# Patient Record
Sex: Female | Born: 1955 | Race: White | Hispanic: No | Marital: Married | Smoking: Current every day smoker
Health system: Southern US, Community
[De-identification: ages and names within clinical notes are randomized; demographics above are authoritative.]

## PROBLEM LIST (undated history)

## (undated) DIAGNOSIS — K8050 Calculus of bile duct without cholangitis or cholecystitis without obstruction: Secondary | ICD-10-CM

## (undated) DIAGNOSIS — I10 Essential (primary) hypertension: Secondary | ICD-10-CM

## (undated) DIAGNOSIS — K219 Gastro-esophageal reflux disease without esophagitis: Secondary | ICD-10-CM

## (undated) DIAGNOSIS — Z8719 Personal history of other diseases of the digestive system: Secondary | ICD-10-CM

## (undated) DIAGNOSIS — F32A Depression, unspecified: Secondary | ICD-10-CM

## (undated) DIAGNOSIS — M1990 Unspecified osteoarthritis, unspecified site: Secondary | ICD-10-CM

## (undated) DIAGNOSIS — Z8639 Personal history of other endocrine, nutritional and metabolic disease: Secondary | ICD-10-CM

## (undated) DIAGNOSIS — K2980 Duodenitis without bleeding: Secondary | ICD-10-CM

## (undated) DIAGNOSIS — F329 Major depressive disorder, single episode, unspecified: Secondary | ICD-10-CM

## (undated) DIAGNOSIS — J449 Chronic obstructive pulmonary disease, unspecified: Secondary | ICD-10-CM

## (undated) DIAGNOSIS — R51 Headache: Secondary | ICD-10-CM

## (undated) DIAGNOSIS — K2970 Gastritis, unspecified, without bleeding: Secondary | ICD-10-CM

## (undated) DIAGNOSIS — R7303 Prediabetes: Secondary | ICD-10-CM

## (undated) DIAGNOSIS — M797 Fibromyalgia: Secondary | ICD-10-CM

## (undated) DIAGNOSIS — R519 Headache, unspecified: Secondary | ICD-10-CM

## (undated) DIAGNOSIS — J45909 Unspecified asthma, uncomplicated: Secondary | ICD-10-CM

## (undated) HISTORY — PX: ERCP: SHX5425

## (undated) HISTORY — PX: CHOLECYSTECTOMY: SHX55

---

## 2006-03-28 ENCOUNTER — Ambulatory Visit: Payer: Self-pay | Admitting: Nurse Practitioner

## 2008-05-09 ENCOUNTER — Inpatient Hospital Stay: Payer: Self-pay | Admitting: Internal Medicine

## 2008-09-11 ENCOUNTER — Ambulatory Visit: Payer: Self-pay

## 2009-11-17 ENCOUNTER — Emergency Department: Payer: Self-pay | Admitting: Emergency Medicine

## 2010-05-09 ENCOUNTER — Emergency Department: Payer: Self-pay | Admitting: Unknown Physician Specialty

## 2012-03-21 ENCOUNTER — Emergency Department: Payer: Self-pay | Admitting: Emergency Medicine

## 2012-08-02 ENCOUNTER — Ambulatory Visit: Payer: Self-pay | Admitting: Internal Medicine

## 2012-12-29 LAB — URINALYSIS, COMPLETE
Bilirubin,UR: NEGATIVE
Ketone: NEGATIVE
Ph: 5 (ref 4.5–8.0)
Protein: 30
RBC,UR: 5 /HPF (ref 0–5)
WBC UR: 70 /HPF (ref 0–5)

## 2012-12-29 LAB — COMPREHENSIVE METABOLIC PANEL
Anion Gap: 6 — ABNORMAL LOW (ref 7–16)
BUN: 10 mg/dL (ref 7–18)
Calcium, Total: 9.4 mg/dL (ref 8.5–10.1)
Chloride: 99 mmol/L (ref 98–107)
Creatinine: 0.99 mg/dL (ref 0.60–1.30)
EGFR (African American): >60
EGFR (Non-African Amer.): >60
Glucose: 126 mg/dL — ABNORMAL HIGH (ref 65–99)
Osmolality: 269 (ref 275–301)
SGOT(AST): 71 U/L — ABNORMAL HIGH (ref 15–37)
SGPT (ALT): 73 U/L (ref 12–78)
Sodium: 134 mmol/L — ABNORMAL LOW (ref 136–145)
Total Protein: 7.6 g/dL (ref 6.4–8.2)

## 2012-12-29 LAB — LIPASE, BLOOD: Lipase: 377 U/L (ref 73–393)

## 2012-12-29 LAB — CBC
MCV: 83 fL (ref 80–100)
Platelet: 435 10*3/uL (ref 150–440)
RBC: 4.06 10*6/uL (ref 3.80–5.20)
RDW: 14.1 % (ref 11.5–14.5)
WBC: 13 10*3/uL — ABNORMAL HIGH (ref 3.6–11.0)

## 2012-12-30 ENCOUNTER — Observation Stay: Payer: Self-pay | Admitting: Surgery

## 2013-01-02 LAB — PATHOLOGY REPORT

## 2014-01-07 ENCOUNTER — Inpatient Hospital Stay: Payer: Self-pay | Admitting: Internal Medicine

## 2014-01-07 LAB — CBC
HCT: 36.8 % (ref 35.0–47.0)
HGB: 11.9 g/dL — ABNORMAL LOW (ref 12.0–16.0)
MCH: 28 pg (ref 26.0–34.0)
MCHC: 32.4 g/dL (ref 32.0–36.0)
MCV: 86 fL (ref 80–100)
PLATELETS: 256 10*3/uL (ref 150–440)
RBC: 4.26 10*6/uL (ref 3.80–5.20)
RDW: 14.2 % (ref 11.5–14.5)
WBC: 10.4 10*3/uL (ref 3.6–11.0)

## 2014-01-07 LAB — LIPASE, BLOOD: Lipase: 369 U/L (ref 73–393)

## 2014-01-07 LAB — URINALYSIS, COMPLETE
BACTERIA: NONE SEEN
Bilirubin,UR: NEGATIVE
Blood: NEGATIVE
Glucose,UR: NEGATIVE mg/dL (ref 0–75)
Ketone: NEGATIVE
NITRITE: NEGATIVE
Ph: 6 (ref 4.5–8.0)
Protein: NEGATIVE
SPECIFIC GRAVITY: 1.01 (ref 1.003–1.030)
Squamous Epithelial: 1

## 2014-01-07 LAB — COMPREHENSIVE METABOLIC PANEL
ALK PHOS: 245 U/L — AB
ALT: 205 U/L — AB
Albumin: 3.8 g/dL (ref 3.4–5.0)
Anion Gap: 8 (ref 7–16)
BUN: 12 mg/dL (ref 7–18)
Bilirubin,Total: 0.8 mg/dL (ref 0.2–1.0)
CALCIUM: 9 mg/dL (ref 8.5–10.1)
CREATININE: 0.88 mg/dL (ref 0.60–1.30)
Chloride: 101 mmol/L (ref 98–107)
Co2: 27 mmol/L (ref 21–32)
EGFR (African American): >60
GLUCOSE: 93 mg/dL (ref 65–99)
Osmolality: 271 (ref 275–301)
Potassium: 4.5 mmol/L (ref 3.5–5.1)
SGOT(AST): 542 U/L — ABNORMAL HIGH (ref 15–37)
Sodium: 136 mmol/L (ref 136–145)
TOTAL PROTEIN: 7.2 g/dL (ref 6.4–8.2)

## 2014-01-07 LAB — TROPONIN I

## 2014-01-08 LAB — BASIC METABOLIC PANEL
ANION GAP: 8 (ref 7–16)
BUN: 8 mg/dL (ref 7–18)
CO2: 29 mmol/L (ref 21–32)
CREATININE: 1.05 mg/dL (ref 0.60–1.30)
Calcium, Total: 8.4 mg/dL — ABNORMAL LOW (ref 8.5–10.1)
Chloride: 99 mmol/L (ref 98–107)
EGFR (African American): >60
EGFR (Non-African Amer.): 57 — ABNORMAL LOW
Glucose: 73 mg/dL (ref 65–99)
Osmolality: 269 (ref 275–301)
Potassium: 3.4 mmol/L — ABNORMAL LOW (ref 3.5–5.1)
SODIUM: 136 mmol/L (ref 136–145)

## 2014-01-08 LAB — CBC WITH DIFFERENTIAL/PLATELET
Basophil #: 0 10*3/uL (ref 0.0–0.1)
Basophil #: 0 10*3/uL (ref 0.0–0.1)
Basophil %: 0.6 %
Basophil %: 0.7 %
EOS ABS: 0.1 10*3/uL (ref 0.0–0.7)
EOS PCT: 3.4 %
Eosinophil #: 0.1 10*3/uL (ref 0.0–0.7)
Eosinophil %: 1.7 %
HCT: 34.8 % — ABNORMAL LOW (ref 35.0–47.0)
HCT: 32.4 % — ABNORMAL LOW (ref 35.0–47.0)
HGB: 11.3 g/dL — ABNORMAL LOW (ref 12.0–16.0)
HGB: 10.4 g/dL — ABNORMAL LOW (ref 12.0–16.0)
LYMPHS ABS: 1.8 10*3/uL (ref 1.0–3.6)
LYMPHS ABS: 1.4 10*3/uL (ref 1.0–3.6)
LYMPHS PCT: 37 %
Lymphocyte %: 36.2 %
MCH: 28.1 pg (ref 26.0–34.0)
MCH: 28.3 pg (ref 26.0–34.0)
MCHC: 32.5 g/dL (ref 32.0–36.0)
MCHC: 32.2 g/dL (ref 32.0–36.0)
MCV: 86 fL (ref 80–100)
MCV: 88 fL (ref 80–100)
MONOS PCT: 10.1 %
Monocyte #: 0.5 x10 3/mm (ref 0.2–0.9)
Monocyte #: 0.4 x10 3/mm (ref 0.2–0.9)
Monocyte %: 9.7 %
NEUTROS ABS: 2.5 10*3/uL (ref 1.4–6.5)
Neutrophil #: 1.9 10*3/uL (ref 1.4–6.5)
Neutrophil %: 51.8 %
Neutrophil %: 48.8 %
PLATELETS: 253 10*3/uL (ref 150–440)
Platelet: 244 10*3/uL (ref 150–440)
RBC: 4.03 10*6/uL (ref 3.80–5.20)
RBC: 3.69 10*6/uL — ABNORMAL LOW (ref 3.80–5.20)
RDW: 14.1 % (ref 11.5–14.5)
RDW: 13.9 % (ref 11.5–14.5)
WBC: 4.9 10*3/uL (ref 3.6–11.0)
WBC: 3.9 10*3/uL (ref 3.6–11.0)

## 2014-01-08 LAB — HEPATIC FUNCTION PANEL A (ARMC)
ALBUMIN: 3.5 g/dL (ref 3.4–5.0)
ALT: 1158 U/L — AB
Alkaline Phosphatase: 319 U/L — ABNORMAL HIGH
Bilirubin, Direct: 0.6 mg/dL — ABNORMAL HIGH (ref 0.0–0.2)
Bilirubin,Total: 1.2 mg/dL — ABNORMAL HIGH (ref 0.2–1.0)
SGOT(AST): >2000 U/L — ABNORMAL HIGH (ref 15–37)
TOTAL PROTEIN: 6.9 g/dL (ref 6.4–8.2)

## 2014-01-08 LAB — COMPREHENSIVE METABOLIC PANEL
ALT: 920 U/L — AB
Albumin: 3.1 g/dL — ABNORMAL LOW (ref 3.4–5.0)
Alkaline Phosphatase: 359 U/L — ABNORMAL HIGH
Anion Gap: 7 (ref 7–16)
BILIRUBIN TOTAL: 1.2 mg/dL — AB (ref 0.2–1.0)
BUN: 6 mg/dL — ABNORMAL LOW (ref 7–18)
CHLORIDE: 105 mmol/L (ref 98–107)
CO2: 27 mmol/L (ref 21–32)
Calcium, Total: 8 mg/dL — ABNORMAL LOW (ref 8.5–10.1)
Creatinine: 1.05 mg/dL (ref 0.60–1.30)
EGFR (Non-African Amer.): 57 — ABNORMAL LOW
Glucose: 74 mg/dL (ref 65–99)
Osmolality: 274 (ref 275–301)
POTASSIUM: 3.8 mmol/L (ref 3.5–5.1)
SGOT(AST): 1365 U/L — ABNORMAL HIGH (ref 15–37)
Sodium: 139 mmol/L (ref 136–145)
Total Protein: 6.5 g/dL (ref 6.4–8.2)

## 2014-01-08 LAB — PROTIME-INR
INR: 0.9
INR: 0.9
PROTHROMBIN TIME: 12 s (ref 11.5–14.7)
PROTHROMBIN TIME: 12.4 s (ref 11.5–14.7)

## 2014-01-08 LAB — MAGNESIUM: Magnesium: 2.4 mg/dL

## 2014-01-09 LAB — COMPREHENSIVE METABOLIC PANEL
ALK PHOS: 366 U/L — AB
ALT: 655 U/L — AB
Albumin: 3 g/dL — ABNORMAL LOW (ref 3.4–5.0)
Anion Gap: 7 (ref 7–16)
BUN: 5 mg/dL — ABNORMAL LOW (ref 7–18)
Bilirubin,Total: 0.7 mg/dL (ref 0.2–1.0)
CALCIUM: 8.2 mg/dL — AB (ref 8.5–10.1)
CHLORIDE: 108 mmol/L — AB (ref 98–107)
CO2: 26 mmol/L (ref 21–32)
CREATININE: 0.99 mg/dL (ref 0.60–1.30)
EGFR (African American): >60
Glucose: 74 mg/dL (ref 65–99)
OSMOLALITY: 277 (ref 275–301)
POTASSIUM: 3.9 mmol/L (ref 3.5–5.1)
SGOT(AST): 829 U/L — ABNORMAL HIGH (ref 15–37)
Sodium: 141 mmol/L (ref 136–145)
Total Protein: 6.4 g/dL (ref 6.4–8.2)

## 2014-01-09 LAB — CBC WITH DIFFERENTIAL/PLATELET
BASOS ABS: 0 10*3/uL (ref 0.0–0.1)
Basophil %: 0.7 %
Eosinophil #: 0.3 10*3/uL (ref 0.0–0.7)
Eosinophil %: 5.1 %
HCT: 32.8 % — ABNORMAL LOW (ref 35.0–47.0)
HGB: 10.7 g/dL — AB (ref 12.0–16.0)
Lymphocyte #: 1.5 10*3/uL (ref 1.0–3.6)
Lymphocyte %: 30.8 %
MCH: 28.2 pg (ref 26.0–34.0)
MCHC: 32.6 g/dL (ref 32.0–36.0)
MCV: 87 fL (ref 80–100)
Monocyte #: 0.5 x10 3/mm (ref 0.2–0.9)
Monocyte %: 9.4 %
NEUTROS PCT: 54 %
Neutrophil #: 2.7 10*3/uL (ref 1.4–6.5)
PLATELETS: 234 10*3/uL (ref 150–440)
RBC: 3.79 10*6/uL — ABNORMAL LOW (ref 3.80–5.20)
RDW: 14.3 % (ref 11.5–14.5)
WBC: 4.9 10*3/uL (ref 3.6–11.0)

## 2014-01-10 LAB — CBC WITH DIFFERENTIAL/PLATELET
BASOS ABS: 0 10*3/uL (ref 0.0–0.1)
Basophil %: 0.4 %
EOS ABS: 0.1 10*3/uL (ref 0.0–0.7)
Eosinophil %: 1.4 %
HCT: 29.2 % — ABNORMAL LOW (ref 35.0–47.0)
HGB: 9.5 g/dL — ABNORMAL LOW (ref 12.0–16.0)
LYMPHS ABS: 1.2 10*3/uL (ref 1.0–3.6)
LYMPHS PCT: 15.5 %
MCH: 28.3 pg (ref 26.0–34.0)
MCHC: 32.5 g/dL (ref 32.0–36.0)
MCV: 87 fL (ref 80–100)
MONOS PCT: 10.9 %
Monocyte #: 0.8 x10 3/mm (ref 0.2–0.9)
Neutrophil #: 5.5 10*3/uL (ref 1.4–6.5)
Neutrophil %: 71.8 %
PLATELETS: 215 10*3/uL (ref 150–440)
RBC: 3.36 10*6/uL — AB (ref 3.80–5.20)
RDW: 14.4 % (ref 11.5–14.5)
WBC: 7.6 10*3/uL (ref 3.6–11.0)

## 2014-01-10 LAB — COMPREHENSIVE METABOLIC PANEL
ALK PHOS: 455 U/L — AB
ALT: 393 U/L — AB
ANION GAP: 8 (ref 7–16)
Albumin: 2.6 g/dL — ABNORMAL LOW (ref 3.4–5.0)
BUN: 4 mg/dL — AB (ref 7–18)
Bilirubin,Total: 2.5 mg/dL — ABNORMAL HIGH (ref 0.2–1.0)
Calcium, Total: 7.7 mg/dL — ABNORMAL LOW (ref 8.5–10.1)
Chloride: 105 mmol/L (ref 98–107)
Co2: 23 mmol/L (ref 21–32)
Creatinine: 0.88 mg/dL (ref 0.60–1.30)
EGFR (Non-African Amer.): >60
Glucose: 95 mg/dL (ref 65–99)
OSMOLALITY: 269 (ref 275–301)
Potassium: 3.7 mmol/L (ref 3.5–5.1)
SGOT(AST): 481 U/L — ABNORMAL HIGH (ref 15–37)
Sodium: 136 mmol/L (ref 136–145)
Total Protein: 5.7 g/dL — ABNORMAL LOW (ref 6.4–8.2)

## 2014-01-10 LAB — LIPASE, BLOOD: Lipase: 244 U/L (ref 73–393)

## 2014-01-11 LAB — CBC WITH DIFFERENTIAL/PLATELET
Basophil #: 0 10*3/uL (ref 0.0–0.1)
Basophil %: 0.4 %
EOS ABS: 0.3 10*3/uL (ref 0.0–0.7)
EOS PCT: 5 %
HCT: 28.2 % — ABNORMAL LOW (ref 35.0–47.0)
HGB: 9.4 g/dL — ABNORMAL LOW (ref 12.0–16.0)
Lymphocyte #: 1.6 10*3/uL (ref 1.0–3.6)
Lymphocyte %: 28.4 %
MCH: 28.5 pg (ref 26.0–34.0)
MCHC: 33.2 g/dL (ref 32.0–36.0)
MCV: 86 fL (ref 80–100)
MONOS PCT: 11.2 %
Monocyte #: 0.6 x10 3/mm (ref 0.2–0.9)
Neutrophil #: 3 10*3/uL (ref 1.4–6.5)
Neutrophil %: 55 %
Platelet: 208 10*3/uL (ref 150–440)
RBC: 3.28 10*6/uL — ABNORMAL LOW (ref 3.80–5.20)
RDW: 14.5 % (ref 11.5–14.5)
WBC: 5.5 10*3/uL (ref 3.6–11.0)

## 2014-01-11 LAB — HEPATIC FUNCTION PANEL A (ARMC)
Albumin: 2.5 g/dL — ABNORMAL LOW (ref 3.4–5.0)
Alkaline Phosphatase: 409 U/L — ABNORMAL HIGH
BILIRUBIN DIRECT: 0.5 mg/dL — AB (ref 0.0–0.2)
Bilirubin,Total: 0.8 mg/dL (ref 0.2–1.0)
SGOT(AST): 283 U/L — ABNORMAL HIGH (ref 15–37)
SGPT (ALT): 281 U/L — ABNORMAL HIGH
TOTAL PROTEIN: 5.5 g/dL — AB (ref 6.4–8.2)

## 2014-01-18 LAB — COMPREHENSIVE METABOLIC PANEL
ALT: 149 U/L — AB
Albumin: 3.9 g/dL (ref 3.4–5.0)
Alkaline Phosphatase: 330 U/L — ABNORMAL HIGH
Anion Gap: 10 (ref 7–16)
BUN: 14 mg/dL (ref 7–18)
Bilirubin,Total: 0.7 mg/dL (ref 0.2–1.0)
CREATININE: 1.15 mg/dL (ref 0.60–1.30)
Calcium, Total: 9.5 mg/dL (ref 8.5–10.1)
Chloride: 96 mmol/L — ABNORMAL LOW (ref 98–107)
Co2: 27 mmol/L (ref 21–32)
GFR CALC NON AF AMER: 51 — AB
Glucose: 142 mg/dL — ABNORMAL HIGH (ref 65–99)
Osmolality: 269 (ref 275–301)
Potassium: 3.5 mmol/L (ref 3.5–5.1)
SGOT(AST): 331 U/L — ABNORMAL HIGH (ref 15–37)
Sodium: 133 mmol/L — ABNORMAL LOW (ref 136–145)
Total Protein: 7.9 g/dL (ref 6.4–8.2)

## 2014-01-18 LAB — URINALYSIS, COMPLETE
Bacteria: NONE SEEN
Bilirubin,UR: NEGATIVE
Blood: NEGATIVE
Glucose,UR: NEGATIVE mg/dL (ref 0–75)
Ketone: NEGATIVE
Nitrite: NEGATIVE
Ph: 6 (ref 4.5–8.0)
Protein: NEGATIVE
RBC,UR: 3 /HPF (ref 0–5)
Specific Gravity: 1.016 (ref 1.003–1.030)
Squamous Epithelial: 2
WBC UR: 5 /HPF (ref 0–5)

## 2014-01-18 LAB — CBC WITH DIFFERENTIAL/PLATELET
BASOS PCT: 0.2 %
Basophil #: 0 10*3/uL (ref 0.0–0.1)
Eosinophil #: 0.1 10*3/uL (ref 0.0–0.7)
Eosinophil %: 0.8 %
HCT: 36.6 % (ref 35.0–47.0)
HGB: 11.7 g/dL — AB (ref 12.0–16.0)
LYMPHS PCT: 12.2 %
Lymphocyte #: 1.5 10*3/uL (ref 1.0–3.6)
MCH: 27.8 pg (ref 26.0–34.0)
MCHC: 32 g/dL (ref 32.0–36.0)
MCV: 87 fL (ref 80–100)
MONOS PCT: 7.9 %
Monocyte #: 0.9 x10 3/mm (ref 0.2–0.9)
Neutrophil #: 9.4 10*3/uL — ABNORMAL HIGH (ref 1.4–6.5)
Neutrophil %: 78.9 %
Platelet: 455 10*3/uL — ABNORMAL HIGH (ref 150–440)
RBC: 4.22 10*6/uL (ref 3.80–5.20)
RDW: 14.8 % — ABNORMAL HIGH (ref 11.5–14.5)
WBC: 11.9 10*3/uL — ABNORMAL HIGH (ref 3.6–11.0)

## 2014-01-18 LAB — LIPASE, BLOOD: Lipase: 789 U/L — ABNORMAL HIGH (ref 73–393)

## 2014-01-18 LAB — TROPONIN I: Troponin-I: <0.02 ng/mL

## 2014-01-19 ENCOUNTER — Inpatient Hospital Stay: Payer: Self-pay | Admitting: Internal Medicine

## 2014-01-19 LAB — COMPREHENSIVE METABOLIC PANEL
ALBUMIN: 3.1 g/dL — AB (ref 3.4–5.0)
AST: 1850 U/L — AB (ref 15–37)
Alkaline Phosphatase: 391 U/L — ABNORMAL HIGH
Anion Gap: 8 (ref 7–16)
BUN: 9 mg/dL (ref 7–18)
Bilirubin,Total: 1.2 mg/dL — ABNORMAL HIGH (ref 0.2–1.0)
CO2: 27 mmol/L (ref 21–32)
CREATININE: 0.95 mg/dL (ref 0.60–1.30)
Calcium, Total: 8.2 mg/dL — ABNORMAL LOW (ref 8.5–10.1)
Chloride: 105 mmol/L (ref 98–107)
Glucose: 100 mg/dL — ABNORMAL HIGH (ref 65–99)
OSMOLALITY: 278 (ref 275–301)
Potassium: 3.6 mmol/L (ref 3.5–5.1)
SGPT (ALT): 853 U/L — ABNORMAL HIGH
SODIUM: 140 mmol/L (ref 136–145)
Total Protein: 6.6 g/dL (ref 6.4–8.2)

## 2014-01-19 LAB — LIPASE, BLOOD: Lipase: 430 U/L — ABNORMAL HIGH (ref 73–393)

## 2014-01-20 LAB — COMPREHENSIVE METABOLIC PANEL
ANION GAP: 9 (ref 7–16)
Albumin: 2.7 g/dL — ABNORMAL LOW (ref 3.4–5.0)
Alkaline Phosphatase: 483 U/L — ABNORMAL HIGH
BILIRUBIN TOTAL: 1.2 mg/dL — AB (ref 0.2–1.0)
BUN: 5 mg/dL — ABNORMAL LOW (ref 7–18)
CALCIUM: 7.9 mg/dL — AB (ref 8.5–10.1)
CO2: 24 mmol/L (ref 21–32)
Chloride: 107 mmol/L (ref 98–107)
Creatinine: 0.68 mg/dL (ref 0.60–1.30)
EGFR (African American): >60
EGFR (Non-African Amer.): >60
Glucose: 88 mg/dL (ref 65–99)
Osmolality: 276 (ref 275–301)
POTASSIUM: 3.7 mmol/L (ref 3.5–5.1)
SGOT(AST): 825 U/L — ABNORMAL HIGH (ref 15–37)
SGPT (ALT): 545 U/L — ABNORMAL HIGH
Sodium: 140 mmol/L (ref 136–145)
TOTAL PROTEIN: 5.9 g/dL — AB (ref 6.4–8.2)

## 2014-01-20 LAB — CBC WITH DIFFERENTIAL/PLATELET
BASOS PCT: 0.4 %
Basophil #: 0 10*3/uL (ref 0.0–0.1)
EOS ABS: 0.2 10*3/uL (ref 0.0–0.7)
EOS PCT: 3.8 %
HCT: 31.1 % — AB (ref 35.0–47.0)
HGB: 10 g/dL — AB (ref 12.0–16.0)
LYMPHS ABS: 1.4 10*3/uL (ref 1.0–3.6)
Lymphocyte %: 24.5 %
MCH: 28.1 pg (ref 26.0–34.0)
MCHC: 32 g/dL (ref 32.0–36.0)
MCV: 88 fL (ref 80–100)
Monocyte #: 0.6 x10 3/mm (ref 0.2–0.9)
Monocyte %: 10 %
Neutrophil #: 3.5 10*3/uL (ref 1.4–6.5)
Neutrophil %: 61.3 %
PLATELETS: 320 10*3/uL (ref 150–440)
RBC: 3.54 10*6/uL — ABNORMAL LOW (ref 3.80–5.20)
RDW: 15 % — AB (ref 11.5–14.5)
WBC: 5.7 10*3/uL (ref 3.6–11.0)

## 2014-01-20 LAB — LIPASE, BLOOD: Lipase: 371 U/L (ref 73–393)

## 2014-01-21 LAB — COMPREHENSIVE METABOLIC PANEL
ALT: 351 U/L — AB
ANION GAP: 5 — AB (ref 7–16)
AST: 447 U/L — AB (ref 15–37)
Albumin: 2.9 g/dL — ABNORMAL LOW (ref 3.4–5.0)
Alkaline Phosphatase: 456 U/L — ABNORMAL HIGH
BUN: 3 mg/dL — AB (ref 7–18)
Bilirubin,Total: 0.7 mg/dL (ref 0.2–1.0)
Calcium, Total: 8.4 mg/dL — ABNORMAL LOW (ref 8.5–10.1)
Chloride: 108 mmol/L — ABNORMAL HIGH (ref 98–107)
Co2: 27 mmol/L (ref 21–32)
Creatinine: 0.69 mg/dL (ref 0.60–1.30)
EGFR (African American): >60
EGFR (Non-African Amer.): >60
Glucose: 90 mg/dL (ref 65–99)
Osmolality: 275 (ref 275–301)
Potassium: 3.3 mmol/L — ABNORMAL LOW (ref 3.5–5.1)
Sodium: 140 mmol/L (ref 136–145)
Total Protein: 6 g/dL — ABNORMAL LOW (ref 6.4–8.2)

## 2014-01-21 LAB — LIPASE, BLOOD: Lipase: 337 U/L (ref 73–393)

## 2014-01-22 LAB — COMPREHENSIVE METABOLIC PANEL
ALBUMIN: 3.1 g/dL — AB (ref 3.4–5.0)
Alkaline Phosphatase: 427 U/L — ABNORMAL HIGH
Anion Gap: 6 — ABNORMAL LOW (ref 7–16)
BUN: 2 mg/dL — AB (ref 7–18)
Bilirubin,Total: 0.6 mg/dL (ref 0.2–1.0)
CALCIUM: 8.5 mg/dL (ref 8.5–10.1)
CO2: 29 mmol/L (ref 21–32)
Chloride: 104 mmol/L (ref 98–107)
Creatinine: 0.81 mg/dL (ref 0.60–1.30)
EGFR (Non-African Amer.): >60
Glucose: 88 mg/dL (ref 65–99)
OSMOLALITY: 273 (ref 275–301)
Potassium: 2.9 mmol/L — ABNORMAL LOW (ref 3.5–5.1)
SGOT(AST): 315 U/L — ABNORMAL HIGH (ref 15–37)
SGPT (ALT): 291 U/L — ABNORMAL HIGH
Sodium: 139 mmol/L (ref 136–145)
Total Protein: 6.8 g/dL (ref 6.4–8.2)

## 2014-01-22 LAB — MAGNESIUM: Magnesium: 1.8 mg/dL

## 2014-01-22 LAB — LIPASE, BLOOD: Lipase: 294 U/L (ref 73–393)

## 2014-01-23 LAB — URINALYSIS, COMPLETE
Bacteria: NONE SEEN
Bilirubin,UR: NEGATIVE
Blood: NEGATIVE
Glucose,UR: NEGATIVE mg/dL (ref 0–75)
KETONE: NEGATIVE
LEUKOCYTE ESTERASE: NEGATIVE
NITRITE: NEGATIVE
PROTEIN: NEGATIVE
Ph: 5 (ref 4.5–8.0)
SPECIFIC GRAVITY: 1.004 (ref 1.003–1.030)
Squamous Epithelial: <1
WBC UR: 2 /HPF (ref 0–5)

## 2014-01-23 LAB — COMPREHENSIVE METABOLIC PANEL
Albumin: 2.9 g/dL — ABNORMAL LOW (ref 3.4–5.0)
Alkaline Phosphatase: 332 U/L — ABNORMAL HIGH
Anion Gap: 6 — ABNORMAL LOW (ref 7–16)
BUN: 3 mg/dL — AB (ref 7–18)
Bilirubin,Total: 0.4 mg/dL (ref 0.2–1.0)
CHLORIDE: 109 mmol/L — AB (ref 98–107)
CREATININE: 0.87 mg/dL (ref 0.60–1.30)
Calcium, Total: 8.4 mg/dL — ABNORMAL LOW (ref 8.5–10.1)
Co2: 27 mmol/L (ref 21–32)
EGFR (African American): >60
EGFR (Non-African Amer.): >60
Glucose: 89 mg/dL (ref 65–99)
Osmolality: 279 (ref 275–301)
Potassium: 4.1 mmol/L (ref 3.5–5.1)
SGOT(AST): 180 U/L — ABNORMAL HIGH (ref 15–37)
SGPT (ALT): 190 U/L — ABNORMAL HIGH
SODIUM: 142 mmol/L (ref 136–145)
Total Protein: 5.9 g/dL — ABNORMAL LOW (ref 6.4–8.2)

## 2014-01-23 LAB — MAGNESIUM: MAGNESIUM: 1.8 mg/dL

## 2014-01-23 LAB — LIPASE, BLOOD: Lipase: 206 U/L (ref 73–393)

## 2014-01-31 ENCOUNTER — Emergency Department: Payer: Self-pay | Admitting: Emergency Medicine

## 2014-01-31 LAB — CBC WITH DIFFERENTIAL/PLATELET
Basophil #: 0.1 10*3/uL (ref 0.0–0.1)
Basophil %: 0.9 %
Eosinophil #: 0.3 10*3/uL (ref 0.0–0.7)
Eosinophil %: 4.2 %
HCT: 39.1 % (ref 35.0–47.0)
HGB: 12.8 g/dL (ref 12.0–16.0)
LYMPHS ABS: 2.2 10*3/uL (ref 1.0–3.6)
LYMPHS PCT: 30.3 %
MCH: 28 pg (ref 26.0–34.0)
MCHC: 32.8 g/dL (ref 32.0–36.0)
MCV: 85 fL (ref 80–100)
Monocyte #: 0.6 x10 3/mm (ref 0.2–0.9)
Monocyte %: 8.6 %
NEUTROS PCT: 56 %
Neutrophil #: 4 10*3/uL (ref 1.4–6.5)
PLATELETS: 435 10*3/uL (ref 150–440)
RBC: 4.59 10*6/uL (ref 3.80–5.20)
RDW: 14.5 % (ref 11.5–14.5)
WBC: 7.1 10*3/uL (ref 3.6–11.0)

## 2014-01-31 LAB — COMPREHENSIVE METABOLIC PANEL
ALBUMIN: 3.8 g/dL (ref 3.4–5.0)
Alkaline Phosphatase: 221 U/L — ABNORMAL HIGH
Anion Gap: 6 — ABNORMAL LOW (ref 7–16)
BILIRUBIN TOTAL: 0.4 mg/dL (ref 0.2–1.0)
BUN: 8 mg/dL (ref 7–18)
CHLORIDE: 100 mmol/L (ref 98–107)
CO2: 31 mmol/L (ref 21–32)
Calcium, Total: 9.9 mg/dL (ref 8.5–10.1)
Creatinine: 1.07 mg/dL (ref 0.60–1.30)
EGFR (African American): >60
EGFR (Non-African Amer.): 56 — ABNORMAL LOW
GLUCOSE: 101 mg/dL — AB (ref 65–99)
OSMOLALITY: 272 (ref 275–301)
POTASSIUM: 3.7 mmol/L (ref 3.5–5.1)
SGOT(AST): 61 U/L — ABNORMAL HIGH (ref 15–37)
SGPT (ALT): 51 U/L
Sodium: 137 mmol/L (ref 136–145)
TOTAL PROTEIN: 7.9 g/dL (ref 6.4–8.2)

## 2014-01-31 LAB — LIPASE, BLOOD: LIPASE: 296 U/L (ref 73–393)

## 2014-01-31 LAB — TROPONIN I: Troponin-I: <0.02 ng/mL

## 2014-03-21 ENCOUNTER — Ambulatory Visit: Payer: Self-pay | Admitting: Gastroenterology

## 2014-06-07 NOTE — H&P (Signed)
History of Present Illness 45 yowf with a few weeks of epigastric pain, which got much worse last evening. Associated with nausea and chills, but no vomiting or fever. Last meal was lunch; anorexic since. Denies changes in urine, stool, skin, or eye color. Denies all GU Sx, including hesitancy, frequency, dysuria, hematuria.   Past History GERD Panic attacks Fibromyalgia HTN Dysipidemia   Past Med/Surgical Hx:  High Cholesterol:   HTN:   GERD - Esophageal Reflux:   ALLERGIES:  Codeine: GI Distress  Tetracycline: Itching  Sulfa drugs: Itching  HOME MEDICATIONS: Medication Instructions Status  naproxen 500 mg oral tablet 1 tab(s) orally 2 times a day Active  predniSONE 20 mg oral tablet 3 tab(s) orally once a day x 5 days Active  ProAir HFA CFC free 90 mcg/inh inhalation aerosol 2 puff(s) inhaled every 4- 6 hours, As Needed- for Shortness of Breath , for Wheezing  Active  omeprazole 20 mg oral enteric coated tablet 1 tab(s) orally once a day  Active  pravastatin 10 mg oral tablet 1 tab(s) orally once a day  Active  Lopressor 50 mg oral tablet 1 tab(s) orally 2 times a day  Active  amitriptyline 50 mg oral tablet 1 tab(s) orally once a day (at bedtime)  Active  Ascriptin Enteric 81 mg oral enteric coated tablet 1  orally once a day  Active  lisinopril 40 mg oral tablet 1 tab(s) orally once a day Active  gabapentin 300 mg oral capsule 1 cap(s) orally 3 times a day Active   Family and Social History:  Family History Non-Contributory   Social History positive  tobacco (Current within 1 year), negative ETOH, negative Illicit drugs, married, 2 kids, doesn't work, smokes 1 - 1.5 ppd   Place of Living Home   Review of Systems:  Fever/Chills No   Cough No   Sputum No   Abdominal Pain Yes   Diarrhea No   Constipation No   Nausea/Vomiting Yes   SOB/DOE No   Chest Pain No   Dysuria No   Tolerating PT Yes   Tolerating Diet Yes  Nauseated   Medications/Allergies  Reviewed Medications/Allergies reviewed   Physical Exam:  GEN well developed, well nourished, no acute distress   HEENT pink conjunctivae, PERRL, hearing intact to voice, moist oral mucosa, Oropharynx clear   NECK supple  No masses   RESP normal resp effort  clear BS  no use of accessory muscles   CARD regular rate  no murmur  no thrills  no JVD  no Rub   ABD denies tenderness  soft   EXTR negative cyanosis/clubbing, negative edema   SKIN normal to palpation, skin turgor good   NEURO cranial nerves intact, follows commands, motor/sensory function intact   PSYCH alert, A+O to time, place, person, poor insight   Lab Results: Hepatic:  14-Nov-14 19:29   Bilirubin, Total 0.4  Alkaline Phosphatase  288  SGPT (ALT) 73  SGOT (AST)  71  Total Protein, Serum 7.6  Albumin, Serum  3.3  Routine Chem:  14-Nov-14 19:29   Glucose, Serum  126  BUN 10  Creatinine (comp) 0.99  Sodium, Serum  134  Potassium, Serum 3.5  Chloride, Serum 99  CO2, Serum 29  Calcium (Total), Serum 9.4  Osmolality (calc) 269  eGFR (African American) >60  eGFR (Non-African American) >60 (eGFR values <4m/min/1.73 m2 may be an indication of chronic kidney disease (CKD). Calculated eGFR is useful in patients with stable renal function. The eGFR calculation  will not be reliable in acutely ill patients when serum creatinine is changing rapidly. It is not useful in  patients on dialysis. The eGFR calculation may not be applicable to patients at the low and high extremes of body sizes, pregnant women, and vegetarians.)  Anion Gap  6  Lipase 377 (Result(s) reported on 29 Dec 2012 at 08:11PM.)  Cardiac:  14-Nov-14 19:29   Troponin I < 0.02 (0.00-0.05 0.05 ng/mL or less: NEGATIVE  Repeat testing in 3-6 hrs  if clinically indicated. >0.05 ng/mL: POTENTIAL  MYOCARDIAL INJURY. Repeat  testing in 3-6 hrs if  clinically indicated. NOTE: An increase or decrease  of 30% or more on serial  testing suggests  a  clinically important change)  Routine UA:  14-Nov-14 19:29   Color (UA) Yellow  Clarity (UA) Hazy  Glucose (UA) Negative  Bilirubin (UA) Negative  Ketones (UA) Negative  Specific Gravity (UA) 1.024  Blood (UA) Negative  pH (UA) 5.0  Protein (UA) 30 mg/dL  Nitrite (UA) Negative  Leukocyte Esterase (UA) 3+ (Result(s) reported on 29 Dec 2012 at 08:04PM.)  RBC (UA) 5 /HPF  WBC (UA) 70 /HPF  Bacteria (UA) TRACE  Epithelial Cells (UA) 9 /HPF  Transitional Epithelial (UA) 1 /HPF  Mucous (UA) PRESENT (Result(s) reported on 29 Dec 2012 at 08:04PM.)  Routine Hem:  14-Nov-14 19:29   WBC (CBC)  13.0  RBC (CBC) 4.06  Hemoglobin (CBC)  11.2  Hematocrit (CBC)  33.6  Platelet Count (CBC) 435 (Result(s) reported on 29 Dec 2012 at 08:00PM.)  MCV 83  MCH 27.6  MCHC 33.3  RDW 14.1   Radiology Results: [place]:    14-Nov-14 23:15, US Abdomen General Survey  US Abdomen General Survey  REASON FOR EXAM:    epigastric pain, worse after eating; UTI; evaluate   for cholecystitis and hydrone  COMMENTS:       PROCEDURE: [place]  - US ABDOMEN GENERAL SURVEY  - Dec 29 2012 11:15PM     CLINICAL DATA:  Epigastric abdominal pain, worse after eating.    EXAM:  ULTRASOUND ABDOMEN COMPLETE    COMPARISON:  None.    FINDINGS:  Gallbladder  A 0.7 cm stone is noted lodged at the gallbladder neck. The  gallbladder appears distended, though the gallbladder wall remains  borderline normal in thickness. A small amount of echogenic sludge  is noted layering dependently within the gallbladder. No definite  pericholecystic fluid is seen. A positive ultrasonographic Murphy's  sign is elicited, raising concern for obstruction.    Common bile duct    Diameter: 0.8 cm; dilated, possibly reflecting distal obstruction.    Liver    No focal lesion identified. Diffusely increased parenchymal  echogenicity and coarsened echotexture, compatible with fatty  infiltration. The previously noted hepatic cyst is not  visualized on  this study.    IVC    No abnormality visualized.    Pancreas    Visualized portion unremarkable.    Spleen    Size and appearance within normal limits.  Right Kidney    Length: 9.4 cm. Echogenicity within normal limits. No mass or  hydronephrosis visualized.    Left Kidney    Length: 9.4 cm. Echogenicity within normal limits. No mass or  hydronephrosis visualized.    Abdominal aorta    No aneurysm visualized.     IMPRESSION:  1. Mild dilatation of the common hepatic duct, with dilatation of  the gallbladder and a positive ultrasonographic Murphy's sign. This  raises concern for distal  obstruction.  2. 0.7 cm stone also noted lodged at the neck of the gallbladder,  with sludge seen layering dependently in the gallbladder.  3. Diffuse fatty infiltration within the liver.      Electronically Signed    By: Garald Balding M.D.    On: 12/29/2012 23:30         Verified By: JEFFREY . CHANG, M.D.,  LabUnknown:  PACS Image    Assessment/Admission Diagnosis Acute cholecystitis UTI   Plan Admit, IV ABx Lap ccy in AM. Pt and husband unstand 1/200 risk of CBD injury and agree.   Electronic Signatures: Consuela Mimes (MD)  (Signed [phone] 00:27)  Authored: CHIEF COMPLAINT and HISTORY, PAST MEDICAL/SURGIAL HISTORY, ALLERGIES, HOME MEDICATIONS, FAMILY AND SOCIAL HISTORY, REVIEW OF SYSTEMS, PHYSICAL EXAM, LABS, Radiology, ASSESSMENT AND PLAN   Last Updated: 15-Nov-14 00:27 by Consuela Mimes (MD)

## 2014-06-07 NOTE — Op Note (Signed)
PATIENT NAME:  Jaime Kidd, Jaime Kidd MR#:  [id number] DATE OF BIRTH:  11/25/1955  DATE OF PROCEDURE:  12/30/2012  OPERATION PERFORMED: Laparoscopic cholecystectomy  PREOPERATIVE DIAGNOSIS:  Acute cholecystitis.  POSTOPERATIVE DIAGNOSIS:  Acute cholecystitis, suppurative.  SURGEON: Consuela Mimes, MD  ANESTHESIA: General.   PROCEDURE IN DETAIL: The patient was placed supine on the Operating Room table, and prepped and draped in the usual sterile fashion. A 15 mmHg CO2 pneumoperitoneum was created with a Veress needle in the infraumbilical position, and this was exchanged for a 5 mm trocar and a 30-degree angled laparoscope. Remaining trocars were placed under direct visualization. The gallbladder was decompressed of clear bile, and the fundus was grasped and retracted superiorly and ventrally. Filmy adhesions which were somewhat fibrinous were taken down from the gallbladder neck, and this was retracted laterally to open up the triangle of Calot. There was a significant amount of edema here. Dissection revealed a clear anterior cystic artery going directly into the gallbladder. This was doubly clipped and divided. The cystic duct was then dissected out, and in doing so, a hole was made in the gallbladder cystic duct junction, and 10 or 20 mL of pus drained through this hole. The cystic duct was adequately controlled with 2 hemoclips and divided, and the gallbladder was removed from the liver bed with the electrocautery, placed in an Endo Catch bag, and extracted from the abdomen via the epigastric port. This port site fascia was closed with a laparoscopic puncture closure device and 0 Vicryl suture, and then the right upper quadrant was suctioned clear and irrigated serially with warm normal saline, being suctioned clear between each successive irrigation. At the conclusion, the right upper quadrant was completely clean and dry, and there was no evidence of pus or bile staining. Hemostasis was excellent.  I had to place 2 or 3 clips on a large posterior cystic artery at the liver bed during the gallbladder removal, in addition to the 4 previously-placed clips. The peritoneum was desufflated and decannulated, and all 4 skin sites were closed with subcuticular 5-0 Monocryl and suture strips. The patient tolerated the procedure well. There were no complications.      ____________________________ Consuela Mimes, MD wfm:mr D: 12/30/2012 22:15:14 ET T: 12/30/2012 22:36:19 ET JOB#: [id number]  cc: Consuela Mimes, MD, <Dictator> Consuela Mimes MD ELECTRONICALLY SIGNED 12/31/2012 21:06

## 2014-06-08 NOTE — Consult Note (Signed)
Details:   - GI Note:  Abd pain decreased. Toelrating clear liquids.  No f/c, no n/v.  Exam: vss nad, a and o abd mild epi tenderness, nabs, soft  Labs: liver enzymes improved, but AST,ALT still quite elevated.   MRCP showed CBD dil and filing defect in CBD, likely stones.   a/p - ERCP Mon or Tues depending on advanced endoscopis t availability - npo mn - no am anti-coag - watch for s/s cholangitis   Electronic Signatures: Arther Dames (MD)  (Signed 06-Dec-15 17:24)  Authored: Details   Last Updated: 06-Dec-15 17:24 by Arther Dames (MD)

## 2014-06-08 NOTE — Consult Note (Signed)
ERCP done. Few stones/sludge present. These were removed. However, bile still not draining possibly due to diverticulum pushing on CBD. Therefore, 10 Fr x 5 cm biliary stent placed. Bile draining now. Clear liquids rest of today. Moniter LFT. Hopefully, discharge soon. Will need to repeat ERCP in 2 months or so as outpt to remove stent and to make sure bile duct is cleared.  Electronic Signatures: Verdie Shire (MD)  (Signed on 07-Dec-15 13:41)  Authored  Last Updated: 07-Dec-15 13:41 by Verdie Shire (MD)

## 2014-06-08 NOTE — Consult Note (Signed)
PATIENT NAME:  Jaime Kidd, Jaime Kidd MR#:  [id number] DATE OF BIRTH:  08-08-1955  DATE OF CONSULTATION:  01/08/2014  REFERRING PHYSICIAN:   CONSULTING PHYSICIAN:  Icarus Partch A. Chrishelle Zito, MD  REASON FOR CONSULTATION: Abdominal pain, elevated LFTs, CT scan showing common bile duct stones, post cholecystectomy.    HISTORY OF PRESENT ILLNESS: Jaime Kidd is a pleasant 59 year old who has had a history of cholecystectomy for cholecystitis approximately 1 year ago, who presents to the ED with epigastric pain, which she has had multiple episodes of in the past, but this time became severe, with some nausea, no vomiting or diarrhea. No fever or chills, night sweats, shortness of breath, cough, chest pain, diarrhea, constipation, dysuria or hematuria.   PAST MEDICAL HISTORY: GERD, panic attacks, hypertension, fibromyalgia, hyperlipidemia, status post cholecystectomy 1 year ago, history of C-section.   HOME MEDICATIONS: Pravastatin, omeprazole, naproxen, Lopressor, lisinopril, gabapentin, aspirin,  amitriptyline, and Align.   SOCIAL HISTORY: Smokes 1 pack a day. Denies alcohol or drugs.   FAMILY HISTORY: Diabetes, colon cancer.   REVIEW OF SYSTEMS: A 12-point review of systems was obtained. Pertinent positives and negatives as above.   PHYSICAL EXAMINATION:  VITAL SIGNS: Temperature 98.2, pulse 84, blood pressure 149/80, respirations 20, 93% on room air.  GENERAL: No acute distress. Alert and oriented x 3.  HEAD: Normocephalic, atraumatic.  EYES: No scleral icterus. No conjunctivitis.  FACE: No obvious face trauma.  EARS, NOSE, AND THROAT: Throat is normal. Nose is normal. Normal external ears.  CHEST: Lungs clear to auscultation. Moving air well.  HEART: Regular rate and rhythm. No murmurs, rubs, or gallops.  ABDOMEN: Soft, mildly tender in epigastrium, nondistended.  EXTREMITIES: Moves all extremities well. Strength 5/5.  NEUROLOGIC: Cranial nerves II through XII grossly intact. Sensation intact  in all 4 extremities.   LABORATORY DATA: White cell count of 10.4 on admission, currently 4.9; creatinine is was 1.05. At 4:00 yesterday, alkaline phosphatase was 245, AST was 542, ALT was 205, lipase was 369.   Ultrasound shows common bile duct of 5 mm, status post cholecystectomy. CT scan shows multiple distal common duct stones with mildly dilated intra and extrahepatic bile ducts.   ASSESSMENT AND PLAN: Jaime Kidd is a 59 year old female with retained common bile duct stones, status post cholecystectomy. Agree with GI consult for ERCP for stone extraction. No acute surgical issues at this time.    ____________________________ Glena Norfolk Faten Frieson, MD cal:MT D: 01/08/2014 08:15:18 ET T: 01/08/2014 08:35:32 ET JOB#: [id number]  cc: Harrell Gave A. Aquan Kope, MD, <Dictator> Floyde Parkins MD ELECTRONICALLY SIGNED 01/21/2014 20:08

## 2014-06-08 NOTE — Consult Note (Signed)
Pt had CBD stones and sludge which were removed yesterday via ERCP with sphincterotomy and balloon sweep of CBD.  Her GB was removed over a year ago.  She feels better today but she still has focal RUQ pain and tenderness on palpation and with deep breath.  Her AST and ALT are down but her TB is up.  I recommend she stay another day in the hospital on iv antibiotics and get a repeat LFT's in morning and if improved and pain better she could go home on Levaquin for a few days and follow up in office if needed.  Will discuss with Hospitalist.  Electronic Signatures: Manya Silvas (MD)  (Signed on 26-Nov-15 12:15)  Authored  Last Updated: 26-Nov-15 12:15 by Manya Silvas (MD)

## 2014-06-08 NOTE — H&P (Signed)
PATIENT NAME:  Jaime Kidd, Jaime Kidd MR#:  [id number] DATE OF BIRTH:  04-24-1955  DATE OF ADMISSION:  01/19/2014  REFERRING PHYSICIAN: Owens Shark.   PRIMARY CARE PHYSICIAN: Ellamae Sia, MD    CHIEF COMPLAINT:  Abdominal pain.   HISTORY OF PRESENT ILLNESS: A 59 year old Caucasian female with a past medical history of gastroesophageal reflux disease as well as cholecystectomy about 1 year ago and recent bout of pancreatitis with an ERCP performed on 01/09/2014 presenting with abdominal pain. She had markedly improved since discharge from [facility] after a recent bout of pancreatitis, however, today had acute onset epigastric pain. She describes feeling as "punched the stomach." Intensity 8 to 9 out of 10, radiating to the back, worse with food, no relieving factors, acutely worse when she was eating dinner which was  spaghetti. Has  had associated nausea, vomiting, nonbloody, nonbilious emesis. Presented to the hospital for further work-up and evaluation.   REVIEW OF SYSTEMS:   CONSTITUTIONAL: Denies any fevers, fatigue, weakness.  EYES: Denies blurred vision, double vision, eye pain.  EARS, NOSE, THROAT: Denies tinnitus, ear pain, hearing loss.  RESPIRATORY:  Denies cough, wheeze, shortness of breath.    CARDIOVASCULAR: Denies chest pain, palpitations, edema.  GASTROINTESTINAL: Positive nausea, vomiting, abdominal pain. Denies any diarrhea or constipation.  GENITOURINARY: Denies dysuria or hematuria.  ENDOCRINE: Denies nocturia or thyroid problems. HEMATOLOGIC/LYMPHATIC:  Denies easy bruising or bleeding.  SKIN: Denies rash or lesion.  MUSCULOSKELETAL: Denies pain in neck, back, shoulder, knees, hips or arthritic symptoms.  NEUROLOGIC: Denies paralysis, paresthesias.  PSYCHIATRIC: Denies anxiety or depressive symptoms.  Otherwise full review of systems performed by me is negative.     PAST MEDICAL HISTORY: Gastroesophageal reflux disease, panic attacks, hypertension, fibromyalgia,  hyperlipidemia, cholecystectomy, recent pancreatitis with ERCP performed on 01/09/2014.   SOCIAL HISTORY: Everyday tobacco use. Denies any alcohol or drug use.   FAMILY HISTORY:  Positive for diabetes.   ALLERGIES: CODEINE, SULFA DRUGS, TETRACYCLINE.   HOME MEDICATIONS: Include aspirin 81 mg p.o. daily, lisinopril 40 mg p.o. daily, gabapentin 300 mg p.o. 3 times daily, amitriptyline 50 mg p.o. at bedtime, Lopressor 50 mg p.o. b.i.d., Align probiotic 4 mg p.o. daily, Prilosec 40 mg p.o. daily.   PHYSICAL EXAMINATION:  VITAL SIGNS: Temperature 97.6, heart rate 78, respirations 18, blood pressure 146/63, saturating 99% on room air. Weight 63.5 kg, BMI 27.3.  GENERAL: Well-nourished, well-developed, Caucasian female currently in no acute distress.  HEAD: Normocephalic, atraumatic.  EYES: Pupils equal, round, reactive to light. Extraocular muscles intact. No scleral icterus.  MOUTH: Moist mucosal membrane. Dentition intact. No abscess noted.  EAR, NOSE, THROAT: Clear without exudates. No external lesions.  NECK: Supple. No thyromegaly. No nodules. No JVD.  PULMONARY: Clear to auscultation bilaterally without wheezes, rales, or rhonchi. No use of accessory muscles. Good respiratory effort.  CHEST: Nontender tender to palpation.  CARDIOVASCULAR: S1, S2, regular rate and rhythm. No murmurs, rubs or gallops. No edema. Pedal pulses 2+ bilaterally. GASTROINTESTINAL: Soft. Mild distention, positive tenderness in the epigastric region without rebound or motion tenderness. No guarding. Positive bowel sounds. No hepatosplenomegaly.  MUSCULOSKELETAL: No swelling, clubbing, edema. Range of motion is full in all extremities.  NEUROLOGIC: Cranial nerves II through XII intact. No gross focal neurological deficits. Sensation intact. Reflexes intact.  SKIN: No ulceration, lesions, rash, or cyanosis. Skin is warm and dry. Turgor intact.  PSYCHIATRIC: Mood and affect within normal limits. The patient is awake,  alert, oriented x 3. Insight and judgment intact.   LABORATORY DATA:  Sodium of 133, potassium 3.5, chloride 96, bicarbonate of 27, BUN 14, creatinine 1.5, glucose 142, lipase 789. LFTs: Alkaline phosphatase of 330, AST of 331, ALT 149, troponin less than 0.02. WBC 11.9, hemoglobin 11.7, platelets of 455. Urinalysis: WBC of 5, RBC 3, leukocyte esterase 1+, epithelial cells 1,  ASSESSMENT AND PLAN: A 59 year old Caucasian female with a history of gastroesophageal reflux disease,  cholecystectomy about a year ago, recent pancreatitis as well as ERCP performed on 01/09/2014 presenting with abdominal pain.  1.  Pancreatitis. Treatment course: IV fluid hydration with normal saline and pain medications as required, n.p.o. status, consult gastroenterology. As far as etiology it seems that would be an extremely late onset for ERCP induced pancreatitis, however, a possible pseudocyst formation. Her symptoms are improved currently pain 4/10. If her condition worsens or there is no continued improvement, get a CT abdomen and pelvis to search for a pseudocyst, however, as she is continuing to improve our treatment plan would not change even if CT scan was performed.  2.  Transaminitis. She is improving from discharge.  3.  Hypertension. Continue Lopressor and lisinopril.   4.  Gastroesophageal reflux disease. PPI therapy.   5.  Venous thromboembolic prophylaxis. Heparin subcutaneous.    CODE STATUS: The patient is full code.   TIME SPENT: 45 minutes.   ____________________________ Aaron Mose. Hower, MD dkh:AT D: 01/19/2014 01:44:25 ET T: 01/19/2014 02:12:59 ET JOB#: [id number]  cc: Aaron Mose. Hower, MD, <Dictator> DAVID Woodfin Ganja MD ELECTRONICALLY SIGNED 01/19/2014 20:35

## 2014-06-08 NOTE — Discharge Summary (Signed)
PATIENT NAME:  Jaime Kidd, Jaime Kidd MR#:  [id number] DATE OF BIRTH:  [birth year]  DATE OF ADMISSION:  01/07/2014 DATE OF DISCHARGE:  01/11/2014  DISCHARGE DIAGNOSES: 1. Choledocholithiasis.  2. Abnormal liver function tests. 3. Hyperlipidemia.  4. Anemia.  5. Hypertension.  6. Gastroesophageal reflux disease.  PROCEDURES: ERCP with common bile duct stone removal.   CONDITION: Stable.  CODE STATUS: Full code.   HOME MEDICATIONS: Please refer to the medication reconciliation list. The patient will be given Levaquin 500 mg p.o. daily for 5 days. In addition, since the patient had an abnormal liver function test, pravastatin was discontinued. Naproxen was discontinued.   DIET: Low-sodium, low-fat, low-cholesterol, 2-gram sodium diet.   ACTIVITY: As tolerated.  FOLLOWUP CARE: Follow up with PCP and Dr. Rayann Heman within 1 to 2 weeks.   REASON FOR ADMISSION: Abdominal pain.   HOSPITAL COURSE: The patient is a 59 year old Caucasian female with a history of hypertension, who presented to the ED with abdominal pain one day, which is in acid epigastric area, sharp, 10/10 at maximum, radiation to back, constant. The patient had a CAT scan of abdomen. For detailed history and physical examination, please refer to the admission note dictated by me on admission date.   LABORATORY DATA: Showed normal. CBC, lipase 369, troponin less than 0.02, BUN 12, creatinine 0.88, bilirubin 0.8, alkaline phosphatase 245, SGPT 205, SGOT 542 CAT scan of the abdomen and pelvis showed multiple distal common bile duct stones creating biliary obstruction.   After admission, the patient was kept NPO with IV fluid support. Dr. Pat Patrick suggested a GI consult for ERCP. In addition, Dr. Candace Cruise did ERCP with common bile duct stone removal. After ERCP, the patient's abdominal pain improved. However, the patient's liver function tests are getting better, only except bilirubin elevated at 2.5 yesterday but decreased to 0.8 today. The patient has  been treated with Zosyn, and Dr. Pat Patrick suggested to discontinue Zosyn and change to p.o. Levaquin for 5 days after discharge. For tobacco abuse, the patient was counseled for smoking cessation, has been treated with a nicotine patch. For hypertension, the patient's blood pressure has been controlled. Continue the patient's home medications, lisinopril and Lopressor. The patient has no complaints. Vital signs are stable. She is clinically stable. We will be discharging her home today. I discussed the patient's discharge plan with the patient, nurse and case manager.   TIME SPENT: About 36 minutes.    ____________________________ Demetrios Loll, MD qc:jh D: 01/11/2014 14:05:34 ET T: 01/11/2014 15:24:35 ET JOB#: [id number]  cc: Demetrios Loll, MD, <Dictator> Demetrios Loll MD ELECTRONICALLY SIGNED 01/11/2014 16:10

## 2014-06-08 NOTE — Consult Note (Signed)
Details:   - GI Note:  s/p ercp with stone extraction.  LFT down today.  Some new hypogastric pain, started after passing bowels.   O: vss a and o x4, nad abd: mild diffuse tenderness, nabs, soft  Labs: lipase nrml ast,alt trending down.   a/p: CBD stones, s/p extraction.   Lipase nml liver enzymes trrending down.  Suspect the new hypogastric pain not related to panc or bile duct.   Electronic Signatures: Arther Dames (MD)  (Signed 08-Dec-15 22:24)  Authored: Details   Last Updated: 08-Dec-15 22:24 by Arther Dames (MD)

## 2014-06-08 NOTE — Discharge Summary (Signed)
PATIENT NAME:  Jaime Kidd, Jaime Kidd MR#:  [id number] DATE OF BIRTH:  September 29, 1955  DATE OF ADMISSION:  01/19/2014 DATE OF DISCHARGE:  01/23/2014  ADMISSION DIAGNOSIS: Acute recurrent pancreatitis.   DISCHARGE DIAGNOSES:  1. Choledocholithiasis with elevated liver function tests.  2. Acute recurrent pancreatitis.  3. Essential hypertension.  4. Gastroesophageal reflux disease.   CONSULTATIONS: GI.   PROCEDURES: 01/21/2014: The patient underwent ERCP. She had filling a defect consistent with a stone and sludge was seen on the cholangiogram. The biliary tree was swept and 1 temporary stent was placed into the common bile duct, choledocholithiasis was found and removed.   LABORATORIES AT DISCHARGE: Lipase is 206, sodium 142, potassium 4.1, chloride 109, bicarbonate 27, BUN 3, creatinine 0.87, glucose 89, alkaline phosphatase 332, bilirubin 0.4, ALT 190, AST is 180, total protein 5.9, albumin is 2.9.   DISCHARGE PHYSICAL EXAMINATION:  VITAL SIGNS: The patient is afebrile. Temperature 97.9, pulse 65, respirations 18, blood pressure 144/77, 94% on room air.  GENERAL: The patient is alert and denies acute distress.  CARDIOVASCULAR: Regular rate and rhythm. No murmurs, gallops, or rubs. PMI is not displaced.  LUNGS: Clear to auscultation without crackles, rales, rhonchi or wheezing. Normal to percussion.  ABDOMEN:  Bowel sounds are positive. Nontender, nondistended. No hepatosplenomegaly.  EXTREMITIES: No clubbing, cyanosis or edema.   HOSPITAL COURSE:  This is a very pleasant 59 year old female who had ERCP about a week prior to admission, who presented with abdominal pain, was found to have pancreatitis as well as common bile duct stone. For further details, please refer to the H and P.  1. Elevated liver  function tests with common bile duct stones. The patient presented with abdominal pain. She had acute pancreatitis. She had common bile duct stones. Her liver function tests were elevated secondary  to choledocholithiasis. She had a recent ERCP last week prior to admission and the stones were extracted. She again underwent ERCP on 01/21/2014, which again showed stones in the common bile duct. She had a stent placed. The stones were extracted. She will need a repeat ERCP in 2 months with stent removal. Her LFTs have drastically improved.  2. Acute recurrent pancreatitis. The patient's pancreatitis has resolved. Her lipase is trending down. She was initially placed on supportive care. She is now tolerating her regular diet. 3. Common bile duct stones, status post ERCP last week and again on 01/21/2014. She will follow up with gastroenterology in 2 months for removal of her temporary biliary stent that was placed on 01/21/2014. 4. Essential hypertension. The patient will continue outpatient medications.  5. History of gastroesophageal reflux disease. The patient is on a proton pump inhibitor.   DISCHARGE MEDICATIONS:  1. Lopressor 50 mg b.i.d.  2. Amitriptyline 50 mg at bedtime.  3. Lisinopril 40 mg daily.  4. Omeprazole 40 mg daily.  5. Gabapentin 300 mg t.i.d.   DISCHARGE DIET: Low sodium.   DISCHARGE ACTIVITY: As tolerated.   DISCHARGE FOLLOW UP: The patient will follow up with Dr. Candace Cruise in 4 weeks.   TIME SPENT: 35 minutes.   The patient was stable for discharge.    ____________________________ Jazon Jipson P. Benjie Karvonen, MD spm:kl D: 01/23/2014 13:13:12 ET T: 01/23/2014 17:08:18 ET JOB#: [id number]  cc: Joshiah Traynham P. Benjie Karvonen, MD, <Dictator> Lupita Dawn. Candace Cruise, MD Donell Beers Dejane Scheibe MD ELECTRONICALLY SIGNED 01/24/2014 13:58

## 2014-06-08 NOTE — Consult Note (Signed)
ERCP done for Dr. Rayann Heman. 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 Difficult to find and more difficult to cannulation due to its location. However, able to cannulate with guidewire. Biliary sphincterotomy done and multiple balloon sweeps done with passage of stones/sludge. Keep on clears rest of today. Recheck LFT tomorrow. Will see if LFT improves over next few days. Thanks  Electronic Signatures: Verdie Shire (MD)  (Signed on 25-Nov-15 14:33)  Authored  Last Updated: 25-Nov-15 14:33 by Verdie Shire (MD)

## 2014-06-08 NOTE — H&P (Signed)
PATIENT NAME:  Jaime Kidd, KOK MR#:  [id number] DATE OF BIRTH:  11-Jul-1955  DATE OF ADMISSION:  01/07/2014  PRIMARY CARE PHYSICIAN: Dr. Quay Burow    REFERRING PHYSICIAN: Jon Gills. Lord, MD   CHIEF COMPLAINT: Abdominal pain today.   HISTORY OF PRESENT ILLNESS: A 59 years old Caucasian female with a history of hypertension who presented to the ED with above chief complaint. The patient is alert, awake, oriented, in no acute distress. The patient said she started to have epigastric pain today, which is sharp, 10/10 at maximum, constant, radiation to back. In addition, the patient has some fever, chills. The patient had 2 days of abdominal pain before, which resolved, but the patient had abdominal pain again today. The patient had some nausea but no vomiting or diarrhea. No melena or bloody stool.   GENITOURINARY: No dysuria, hematuria, or incontinence.   The patient's CAT scan of the abdomen showed stones seen in the common bile duct. Dr. Pat Patrick suggested a GI consult for ERCP and to start Zosyn.   PAST MEDICAL HISTORY: GERD, panic attack, hypertension, fibromyalgia, hyperlipidemia.   SURGICAL HISTORY:  Cholecystectomy last year and C-section.   SOCIAL HISTORY: Smokes 1 pack a day for many years. Denies any alcohol drinking or illicit drugs.   FAMILY HISTORY: Diabetes. Father had colon cancer.   ALLERGIES: CODEINE, SULFA DRUGS, TETRACYCLINE.   HOME MEDICATIONS: Pravastatin 40 mg p.o. at bedtime, omeprazole 40 mg p.o. daily, Naproxen 500 mg p.o. b.i.d., Lopressor 50 mg p.o. b.i.d., lisinopril 40 mg p.o. daily, gabapentin 300 mg p.o. t.i.d., aspirin 81 mg p.o. daily, amitriptyline 50 mg p.o. at bedtime, Align 4 mg p.o. daily.   REVIEW OF SYSTEMS:  CONSTITUTIONAL: The patient has fever, chills. No headache or dizziness or weakness.  EYES: No double vision, blurry vision.  EARS, NOSE, AND THROAT: No postnasal drip, slurred speech or dysphagia.  CARDIOVASCULAR: No chest pain, palpitation,  orthopnea, or nocturnal dyspnea. No leg edema.  PULMONARY: No cough, sputum, shortness of breath, or hemoptysis.  GASTROINTESTINAL: Positive for abdominal pain, nausea. No vomiting, diarrhea or melena or bloody stool.  GENITOURINARY: No dysuria, hematuria, or incontinence.  SKIN: No rash or jaundice.  NEUROLOGY: No syncope, loss of consciousness, or seizure.  ENDOCRINOLOGY: No polyuria or polydipsia, heat or cold intolerance.  HEMATOLOGY: No easy bruising or bleeding.   PHYSICAL EXAMINATION:  VITAL SIGNS:  Temperature 97.5, blood pressure 143/95, pulse 81, O2 saturation 99% on room air.  GENERAL: The patient is alert, awake and oriented, in no acute distress.  HEENT: Pupils round, equal, react to light and accommodation. Moist oral mucosa. Clear pharynx.  NECK: Supple. No JVD or carotid bruit. No lymphadenopathy. No thyromegaly,  CARDIOVASCULAR: S1 and S2, regular rate and rhythm. No murmurs or gallops.  PULMONARY: Bilateral air entry. No wheezing or rales. No use of accessory muscles to breathe.  ABDOMEN: Soft. Tenderness in epigastric area and right upper quadrant. No rigidity. No rebound. Bowel sounds present. No organomegaly.  EXTREMITIES: No edema, clubbing or cyanosis. No calf tenderness. Bilateral pedal pulses present.  SKIN: No rash or jaundice.  NEUROLOGY: A and O x 3. No focal deficit. Power 5/5. Sensation intact.   LABORATORY DATA: Urinalysis is negative. CBC in normal range. Lipase is 369, troponin less than 0.02. Glucose 93, BUN 12, creatinine 0.88. Electrolytes are normal. Bilirubin 0.8, alkaline phosphatase 245, SGPT 205, SGOT 542.   CAT scan of abdomen and pelvis shows multiple distal common bile duct stones creating biliary obstruction.   Abdominal ultrasound, status  post cholecystectomy, no acute abnormality.   Abdomen 3 way including PA chest negative.   EKG shows normal sinus rhythm at 66 BPM.   IMPRESSIONS:  1.  Choledocholithiasis with common bile duct  obstruction.  2.  Hypertension.  3.  Hyperlipidemia.  4.  Abnormal liver function tests possibly due to choledocholithiasis.   5.  Tobacco abuse.   PLAN OF TREATMENT:  1.  The patient will be admitted to medical floor. I will start Zosyn and follow up CBC and we will get a GI consult from Dr. Gustavo Lah for possible ERCP and follow up Dr. Pat Patrick. According to Dr. Reita Cliche, Dr. Pat Patrick said no indication for surgery at this time.  2.  Pain control with Zofran p.r.n.  3.  For hypertension, we will continue lisinopril and Lopressor.  4.  We will start clear liquids and keep n.p.o. after midnight with IV fluid support.  5.  For tobacco abuse, the patient was counseled for smoking cessation. We will give nicotine patch. The patient was counseled for smoking cessation for about 4 minutes. We will give nicotine patch.  6.  I discussed the patient's condition and plan of treatment with the patient.   CODE STATUS: The patient wants full code.   TIME SPENT: About 55 minutes.    ____________________________ Demetrios Loll, MD qc:AT D: 01/07/2014 22:53:01 ET T: 01/07/2014 23:07:19 ET JOB#: [id number]  cc: Demetrios Loll, MD, <Dictator> Demetrios Loll MD ELECTRONICALLY SIGNED 01/08/2014 17:10

## 2014-06-08 NOTE — Consult Note (Signed)
PATIENT NAME:  Jaime Kidd, Jaime Kidd MR#:  [id number] DATE OF BIRTH:  [birth year]  DATE OF CONSULTATION:  01/08/2014  REFERRING PHYSICIAN:  Abel Presto, MD CONSULTING PHYSICIAN:  Arther Dames, MD  REASON FOR CONSULTATION: Choledocholithiasis, elevated liver enzymes.   HISTORY OF PRESENT ILLNESS: [name] is a 59 year old female with a past medical history notable for just hypertension, hyperlipidemia and GERD who presented to the Emergency Room for evaluation of epigastric pain. She reports that the epigastric pain started relatively suddenly and was severe in nature radiating to the back. It was described as sharp and it did come on at rest. She does report that she had several less major episodes of this type of pain in the past several weeks. She denies any fevers, chills, nausea or vomiting.   Since she has been in the hospital, her pain has been minimal, but on taking pain medications. She has not developed any fevers or chills while here in the hospital. Of note, she does report a cholecystectomy approximately 1 year ago.   PAST MEDICAL HISTORY: 1.  GERD.  2.  Hypertension.  3.  Fibromyalgia.  4.  Hyperlipidemia.   PAST SURGICAL HISTORY:  1.  Cholecystectomy.  2.  C-section.  SOCIAL HISTORY: She is an ongoing smoker. She denies alcohol.   FAMILY HISTORY: She does have a father with colon cancer.   REVIEW OF SYSTEMS:   CONSTITUTIONAL: No weight gain or weight loss.  No fever or chills. HEENT: No oral lesions or sore throat. No vision changes. GASTROINTESTINAL: See HPI.  HEME/LYMPH: No easy bruising or bleeding. CARDIOVASCULAR: No chest pain or dyspnea on exertion. GENITOURINARY: No hematuria. INTEGUMENTARY: No rashes or pruritus PSYCHIATRIC: No depression/anxiety.  ENDOCRINE: No heat/cold intolerance, no hair loss or skin changes. ALLERGIC/IMMUNOLOGIC: Negative for hives. RESPIRATORY: No cough, no shortness of breath.  MUSCULOSKELETAL: No joint swelling or muscle pain.    HOME MEDICATIONS:  Pravastatin 40 mg at bedtime, omeprazole 40 mg daily, naproxen 500 b.i.d., Lopressor 500 b.i.d., lisinopril 40 daily, gabapentin 300 t.i.d., aspirin 81 daily, amitriptyline 50 mg at bedtime, Align 4 mg daily.  PHYSICAL EXAMINATION: VITAL SIGNS: Currently, her temperature is 98.2. Her pulse is 71, blood pressure 127/74, and pulse ox is 94% on room air.  GENERAL: Alert and oriented times 4.  No acute distress. Appears stated age. HEENT: Normocephalic/atraumatic. Extraocular movements are intact. Anicteric. NECK: Soft, supple. JVP appears normal. No adenopathy. CHEST: Clear to auscultation. No wheeze or crackle. Respirations unlabored. HEART: Regular. No murmur, rub, or gallop.  Normal S1 and S2. ABDOMEN: Soft. Positive for epigastric tenderness to palpation. Nondistended.  Normal active bowel sounds in all 4 quadrants.  No organomegaly. No masses EXTREMITIES: No swelling, well perfused. SKIN: No rash or lesion. Skin color, texture, turgor normal. NEUROLOGICAL: Grossly intact. PSYCHIATRIC: Normal tone and affect. MUSCULOSKELETAL: No joint swelling or erythema.   DIAGNOSTIC DATA: From this morning: Sodium 136, potassium 3.4, BUN 8, creatinine 1.05. Lipase is normal at 369. Her liver enzymes are notable for a total bilirubin of 1.2, an alk phos of 319, AST greater than 2000 and ALT of 1158. Her white count is 4.9, hemoglobin 11.3, and hematocrit is 35. INR 0.9.  CT scan is showing common bile duct stones with new intra and extrahepatic biliary dilation.   ASSESSMENT AND PLAN: 1.  Choledocholithiasis:  She is not exhibiting signs and symptoms of cholangitis at this time. It is reasonable to continue Zosyn for now. She does need to be monitored closely for signs and  symptoms of cholangitis and alert the GI team for development of these symptoms. We will plan for an ERCP tomorrow. If she were to become unstable without an ERCP, if not available until tomorrow, would likely need to  consider a percutaneous biliary drain or transfer to a tertiary center. We will continue to follow. Thank you for this consult.   ____________________________ Arther Dames, MD mr:sb D: 01/08/2014 14:45:40 ET T: 01/08/2014 15:07:36 ET JOB#: [id number]  cc: Arther Dames, MD, <Dictator> Mellody Life MD ELECTRONICALLY SIGNED 01/08/2014 15:35

## 2014-06-12 NOTE — Consult Note (Signed)
PATIENT NAME:  Jaime Kidd, PAULSEN MR#:  [id number] DATE OF BIRTH:  1955-05-20  DATE OF CONSULTATION:  01/19/2014  REFERRING PHYSICIAN:   Valentino Nose, MD  CONSULTING PHYSICIAN:  Arther Dames, MD   REASON FOR CONSULTATION: Epigastric pancreatitis, elevated liver enzymes.   HISTORY OF PRESENT ILLNESS: Ms. Meigs is a 59 year old female with a past medical history notable for GERD, choledocolithiasis who underwent an ERCP with Dr. Candace Cruise back on 01/09/2014.  At the time of that ERCP, she appeared to have some sludge and some small common bile duct stones with a sphincterotomy and sweepage of the stones.   Ms. Gillean reports that she did well until this procedure, until approximately 24 to 48 hours ago, when she developed pretty severe onset of back pain and epigastric pain.  She presented to the Emergency Room and she was found to have an elevated lipase along with elevated liver enzymes.   Here in the hospital, she feels as though things have improved slightly on IV pain medicines and fluid.  She is not having any trouble with fevers, chills, nausea, vomiting.   PAST MEDICAL HISTORY:  1.  GERD.  2.  Hypertension.  3.  Fibromyalgia.  4.  Hyperlipidemia.   PAST SURGICAL HISTORY: She had a cholecystectomy year ago.  She has had a C-section.   SOCIAL HISTORY: She is an ongoing smoker. She does not drink alcohol.   FAMILY HISTORY: She does have a father with colon cancer but no other family history of GI malignancy.   REVIEW OF SYSTEMS:  A 10 system review is conducted.  It is negative except as stated in the history of present illness.   HOME MEDICATIONS:  Aspirin 81 daily, lisinopril 40, gabapentin, amitriptyline, Lopressor Align, Prilosec.   PHYSICAL EXAMINATION:  VITAL SIGNS: Currently, her temperature is 98. Pulse is 100, respirations are 18, blood pressure 140/72, pulse oximetry 98% on room air.  \ GENERAL: Alert and oriented times 4.  No acute distress. Appears stated age. HEENT:  Normocephalic/atraumatic. Extraocular movements are intact. Anicteric. NECK: Soft, supple. JVP appears normal. No adenopathy. CHEST: Clear to auscultation. No wheeze or crackle. Respirations unlabored. HEART: Regular. No murmur, rub, or gallop.  Normal S1 and S2. ABDOMEN: Positive for significant epigastric and left upper quadrant tenderness to palpation.  No right upper quadrant tenderness to palpation. She has normoactive bowel sounds, soft, no rebound or guarding.  No organomegaly. No masses EXTREMITIES: No swelling, well perfused. SKIN: No rash or lesion. Skin color, texture, turgor normal. NEUROLOGICAL: Grossly intact. PSYCHIATRIC: Normal tone and affect. MUSCULOSKELETAL: No joint swelling or erythema.    LABORATORY DATA: Sodium 140, potassium 3.6, BUN 9, creatinine 0.95. Her lipase was 789, now down to 430.  Her liver enzymes currently, AST 1850, ALT 853 and alkaline phosphatase 391, total bilirubin 1.2. Troponin is negative. White count is 12, hemoglobin 11.7, hematocrit 37, platelets are 455,000.    IMAGING: She had an ultrasound, which showed a intrahepatic biliary ductal dilation and status post cholecystectomy. No other abnormal findings.   ASSESSMENT AND PLAN:  Pancreatitis, elevated liver enzymes: The timing for this pancreatitis is unusual for post ERCP pancreatitis. The timing of significantly elevated transaminitis with the relatively preserved total bilirubin is also somewhat atypical.  It is not clear what the underlying issue is at this point. Could she have retained stones despite the ERCP or could this be pancreatitis causing some stricturing of the common bile duct causing the elevated liver enzymes.  I do agree that based  on the atypical presentation not fitting with post ERCP pancreatitis it would be reasonable to perform an MRCP to further classify what is causing her pancreatitis and elevated liver enzymes.   RECOMMENDATIONS:  1.  Agree with MRCP.  2.  Continue IV pain  medication and IV fluids.  3.  Okay with advancing diet as tolerated.  4.  Watch for signs and symptoms of cholangitis and would start Zosyn if displays any of these symptoms.  5.  We will continue to follow.   Thank you for this consult.     ____________________________ Arther Dames, MD mr:DT D: 01/19/2014 15:58:00 ET T: 01/19/2014 16:25:39 ET JOB#: [id number]  cc: Arther Dames, MD, <Dictator> Mellody Life MD ELECTRONICALLY SIGNED 02/18/2014 14:17

## 2014-06-27 ENCOUNTER — Ambulatory Visit: Payer: Medicaid Other

## 2014-06-27 ENCOUNTER — Ambulatory Visit
Admission: EM | Admit: 2014-06-27 | Discharge: 2014-06-27 | Disposition: A | Discharge status: Home / Self Care | Payer: Medicaid Other | Attending: Family Medicine | Admitting: Family Medicine

## 2014-06-27 DIAGNOSIS — M25512 Pain in left shoulder: Secondary | ICD-10-CM | POA: Diagnosis present

## 2014-06-27 DIAGNOSIS — S43422A Sprain of left rotator cuff capsule, initial encounter: Secondary | ICD-10-CM

## 2014-06-27 DIAGNOSIS — M7522 Bicipital tendinitis, left shoulder: Secondary | ICD-10-CM | POA: Diagnosis not present

## 2014-06-27 DIAGNOSIS — M75102 Unspecified rotator cuff tear or rupture of left shoulder, not specified as traumatic: Secondary | ICD-10-CM | POA: Diagnosis not present

## 2014-06-27 DIAGNOSIS — S2232XS Fracture of one rib, left side, sequela: Secondary | ICD-10-CM

## 2014-06-27 DIAGNOSIS — S2232XA Fracture of one rib, left side, initial encounter for closed fracture: Secondary | ICD-10-CM

## 2014-06-27 HISTORY — DX: Gastro-esophageal reflux disease without esophagitis: K21.9

## 2014-06-27 HISTORY — DX: Essential (primary) hypertension: I10

## 2014-06-27 HISTORY — DX: Chronic obstructive pulmonary disease, unspecified: J44.9

## 2014-06-27 MED ORDER — MELOXICAM 15 MG PO TABS: 15.0000 mg | ORAL_TABLET | Freq: Every day | ORAL | Status: DC

## 2014-06-27 NOTE — ED Provider Notes (Signed)
CSN: [id number]     Arrival date & time 06/27/14  1117 History   First MD Initiated Contact with Patient 06/27/14 1237     Chief Complaint  Patient presents with  . Shoulder Pain  . Arm Pain  . Back Pain   (Consider location/radiation/quality/duration/timing/severity/associated sxs/prior Treatment) Patient is a 59 y.o. female presenting with shoulder pain, arm pain, back pain, and chest pain. The history is provided by the patient.  Shoulder Pain Location:  Shoulder and arm Time since incident:  3 months Injury: no   Shoulder location:  L shoulder Arm location:  L arm Pain details:    Quality:  Throbbing, sharp and aching   Radiates to:  L arm and L shoulder   Severity:  Moderate   Onset quality:  Gradual   Progression:  Worsening Chronicity:  New Dislocation: no   Tetanus status:  Unknown Prior injury to area:  No Relieved by:  Nothing Worsened by:  Movement Ineffective treatments:  None tried Associated symptoms: back pain and stiffness   Associated symptoms: no fever and no muscle weakness   Risk factors: no concern for non-accidental trauma and no known bone disorder   Arm Pain Associated symptoms include chest pain.  Back Pain Associated symptoms: chest pain   Associated symptoms: no fever   Chest Pain Pain location:  L lateral chest Pain radiates to:  Does not radiate Pain radiates to the back: no   Pain severity:  Moderate Duration:  3 months Timing:  Constant Progression:  Unchanged Chronicity:  New Context: at rest   Context comment:  Laying onher L side Relieved by:  Nothing Worsened by:  Deep breathing, movement and certain positions Associated symptoms: back pain   Associated symptoms: no fever   Risk factors: hypertension   Risk factors: no birth control, no coronary artery disease, not female and no surgery     Past Medical History  Diagnosis Date  . Hypertension   . GERD (gastroesophageal reflux disease)   . COPD (chronic obstructive pulmonary  disease)    Past Surgical History  Procedure Laterality Date  . Cholecystectomy    . Ercp  12/2012 and 2015   History reviewed. No pertinent family history. History  Substance Use Topics  . Smoking status: Current Every Day Smoker -- 1.00 packs/day    Types: Cigarettes  . Smokeless tobacco: Not on file  . Alcohol Use: No   OB History    No data available     Review of Systems  Constitutional: Negative for fever.  Cardiovascular: Positive for chest pain.  Musculoskeletal: Positive for myalgias, back pain and stiffness.  Skin: Negative for color change.    Allergies  Codeine; Wellbutrin; Sulfa antibiotics; Tetracyclines & related; and Trazodone and nefazodone  Home Medications   Prior to Admission medications   Medication Sig Start Date End Date Taking? Authorizing Provider  albuterol (PROVENTIL HFA;VENTOLIN HFA) 108 (90 BASE) MCG/ACT inhaler Inhale into the lungs every 6 (six) hours as needed for wheezing or shortness of breath.   Yes Historical Provider, MD  amitriptyline (ELAVIL) 50 MG tablet Take 50 mg by mouth at bedtime.   Yes Historical Provider, MD  lisinopril-hydrochlorothiazide (PRINZIDE,ZESTORETIC) 20-12.5 MG per tablet Take 1 tablet by mouth 2 (two) times daily.   Yes Historical Provider, MD  metoprolol (LOPRESSOR) 50 MG tablet Take 50 mg by mouth.   Yes Historical Provider, MD  omeprazole (PRILOSEC) 40 MG capsule Take 40 mg by mouth daily.   Yes Historical Provider, MD  meloxicam (MOBIC) 15 MG tablet Take 1 tablet (15 mg total) by mouth daily. 06/27/14   Frederich Cha, MD   BP 122/58 mmHg  Pulse 75  Temp(Src) 97.3 F (36.3 C) (Tympanic)  Resp 17  Ht 5' (1.524 m)  Wt 139 lb (63.05 kg)  BMI 27.15 kg/m2  SpO2 96% Physical Exam  Constitutional: She is oriented to person, place, and time. She appears well-nourished.  HENT:  Head: Normocephalic and atraumatic.  Eyes: Pupils are equal, round, and reactive to light.  Neck: Neck supple. No tracheal deviation  present. No thyromegaly present.  Cardiovascular: Normal rate and regular rhythm.   Pulmonary/Chest: Effort normal and breath sounds normal.  Musculoskeletal: She exhibits tenderness. She exhibits no edema.  Patient able to raise her L arm partially over her head. Unable to resist pressure when arm is held out and L hand is rotated. He has tenderness over the posterior deltoid area as well as over the bicep tendon as well. She has tenderness over the axillary chest over the L rib. There is no pain unless movement or palpation occurs.  EKG not normal but no acute changes. 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   Neurological: She is alert and oriented to person, place, and time. No cranial nerve deficit. Coordination normal.  Skin: Skin is warm. No rash noted. No erythema.  Vitals reviewed.   ED Course  Procedures (including critical care time) Labs Review Labs Reviewed - No data to display  Imaging Review Dg Ribs Unilateral W/chest Left  06/27/2014   ADDENDUM REPORT: 06/27/2014 14:06  ADDENDUM: After review of the left shoulder x-rays, there is a nondisplaced fracture the left fifth rib which is probably chronic. Correlate with any pain in this area to determine if there could be an acute component.   Electronically Signed   By: Franchot Gallo M.D.   On: 06/27/2014 14:06   06/27/2014   CLINICAL DATA:  Left rib pain  EXAM: LEFT RIBS AND CHEST - 3+ VIEW  COMPARISON:  05/09/2010  FINDINGS: No fracture or other bone lesions are seen involving the ribs. There is no evidence of pneumothorax or pleural effusion. Both lungs are clear. Heart size and mediastinal contours are within normal limits. Apical pleural scarring bilaterally.  IMPRESSION: Negative.  Electronically Signed: By: Franchot Gallo M.D. On: 06/27/2014 13:42   Dg Shoulder Left  06/27/2014   CLINICAL DATA:  59 year old female with posterior left shoulder pain. No known injury. Initial encounter.  EXAM: LEFT SHOULDER -  2+ VIEW  COMPARISON:  None.  FINDINGS: No fracture or dislocation.  No abnormal soft tissue calcifications.  No significant degenerative changes.  Remote left fifth rib fracture.  IMPRESSION: No fracture or dislocation.  No abnormal soft tissue calcifications.  No significant degenerative changes.   Electronically Signed   By: Genia Del M.D.   On: 06/27/2014 13:33   I have explained that the EKG shows nothing acute, the fifth rib FX /abnormality is more likely an old rib FX, she does have a bicep tendonitisi but the pain in the deltoid may be from an injury to her rotator cuff.   MDM   1. Rib fracture, left, sequela   2. Biceps tendonitis on left   3. Rotator cuff (capsule) sprain, left, initial encounter         Frederich Cha, MD 06/30/14 1454

## 2014-06-27 NOTE — Discharge Instructions (Signed)
Shoulder Pain The shoulder is the joint that connects your arms to your body. The bones that form the shoulder joint include the upper arm bone (humerus), the shoulder blade (scapula), and the collarbone (clavicle). The top of the humerus is shaped like a ball and fits into a rather flat socket on the scapula (glenoid cavity). A combination of muscles and strong, fibrous tissues that connect muscles to bones (tendons) support your shoulder joint and hold the ball in the socket. Small, fluid-filled sacs (bursae) are located in different areas of the joint. They act as cushions between the bones and the overlying soft tissues and help reduce friction between the gliding tendons and the bone as you move your arm. Your shoulder joint allows a wide range of motion in your arm. This range of motion allows you to do things like scratch your back or throw a ball. However, this range of motion also makes your shoulder more prone to pain from overuse and injury. Causes of shoulder pain can originate from both injury and overuse and usually can be grouped in the following four categories: Redness, swelling, and pain (inflammation) of the tendon (tendinitis) or the bursae (bursitis). Instability, such as a dislocation of the joint. Inflammation of the joint (arthritis). Broken bone (fracture). HOME CARE INSTRUCTIONS  Apply ice to the sore area. Put ice in a plastic bag. Place a towel between your skin and the bag. Leave the ice on for 15-20 minutes, 3-4 times per day for the first 2 days, or as directed by your health care provider. Stop using cold packs if they do not help with the pain. If you have a shoulder sling or immobilizer, wear it as long as your caregiver instructs. Only remove it to shower or bathe. Move your arm as little as possible, but keep your hand moving to prevent swelling. Squeeze a soft ball or foam pad as much as possible to help prevent swelling. Only take over-the-counter or prescription  medicines for pain, discomfort, or fever as directed by your caregiver. SEEK MEDICAL CARE IF:  Your shoulder pain increases, or new pain develops in your arm, hand, or fingers. Your hand or fingers become cold and numb. Your pain is not relieved with medicines. SEEK IMMEDIATE MEDICAL CARE IF:  Your arm, hand, or fingers are numb or tingling. Your arm, hand, or fingers are significantly swollen or turn white or blue. MAKE SURE YOU:  Understand these instructions. Will watch your condition. Will get help right away if you are not doing well or get worse. Document Released: 11/11/2004 Document Revised: 06/18/2013 Document Reviewed: 01/16/2011 [facility] Patient Information 2015 [place], [place]. This information is not intended to replace advice given to you by your health care provider. Make sure you discuss any questions you have with your health care provider. Chest Wall Pain Chest wall pain is pain in or around the bones and muscles of your chest. It may take up to 6 weeks to get better. It may take longer if you must stay physically active in your work and activities.  CAUSES  Chest wall pain may happen on its own. However, it may be caused by:  A viral illness like the flu.  Injury.  Coughing.  Exercise.  Arthritis.  Fibromyalgia.  Shingles. HOME CARE INSTRUCTIONS   Avoid overtiring physical activity. Try not to strain or perform activities that cause pain. This includes any activities using your chest or your abdominal and side muscles, especially if heavy weights are used.  Put ice  on the sore area.  Put ice in a plastic bag.  Place a towel between your skin and the bag.  Leave the ice on for 15-20 minutes per hour while awake for the first 2 days.  Only take over-the-counter or prescription medicines for pain, discomfort, or fever as directed by your caregiver. SEEK IMMEDIATE MEDICAL CARE IF:   Your pain increases, or you are very uncomfortable.  You have a  fever.  Your chest pain becomes worse.  You have new, unexplained symptoms.  You have nausea or vomiting.  You feel sweaty or lightheaded.  You have a cough with phlegm (sputum), or you cough up blood. MAKE SURE YOU:   Understand these instructions.  Will watch your condition.  Will get help right away if you are not doing well or get worse. Document Released: 02/01/2005 Document Revised: 04/26/2011 Document Reviewed: 09/28/2010 [facility] Patient Information 2015 [place], [place]. This information is not intended to replace advice given to you by your health care provider. Make sure you discuss any questions you have with your health care provider. Rib Fracture A rib fracture is a break or crack in one of the bones of the ribs. The ribs are like a cage that goes around your upper chest. A broken or cracked rib is often painful, but most do not cause other problems. Most rib fractures heal on their own in 1-3 months. HOME CARE  Avoid activities that cause pain to the injured area. Protect your injured area.  Slowly increase activity as told by your doctor.  Take medicine as told by your doctor.  Put ice on the injured area for the first 1-2 days after you have been treated or as told by your doctor.  Put ice in a plastic bag.  Place a towel between your skin and the bag.  Leave the ice on for 15-20 minutes at a time, every 2 hours while you are awake.  Do deep breathing as told by your doctor. You may be told to:  Take deep breaths many times a day.  Cough many times a day while hugging a pillow.  Use a device (incentive spirometer) to perform deep breathing many times a day.  Drink enough fluids to keep your pee (urine) clear or pale yellow.   Do not wear a rib belt or binder. These do not allow you to breathe deeply. GET HELP RIGHT AWAY IF:   You have a fever.  You have trouble breathing.   You cannot stop coughing.  You cough up thick or bloody spit (mucus).    You feel sick to your stomach (nauseous), throw up (vomit), or have belly (abdominal) pain.   Your pain gets worse and medicine does not help.  MAKE SURE YOU:   Understand these instructions.  Will watch your condition.  Will get help right away if you are not doing well or get worse. Document Released: 11/11/2007 Document Revised: 05/29/2012 Document Reviewed: 04/05/2012 [facility] Patient Information 2015 [place], [place]. This information is not intended to replace advice given to you by your health care provider. Make sure you discuss any questions you have with your health care provider.

## 2014-06-27 NOTE — ED Notes (Signed)
States left shoulder and arm has hurt for the last month. Also pain left scapula area. Denies trauma. Also left hip pain radiating over buttock and down posterior leg.

## 2014-11-21 ENCOUNTER — Encounter: Payer: Self-pay | Admitting: *Deleted

## 2014-11-21 ENCOUNTER — Other Ambulatory Visit: Payer: Self-pay

## 2014-11-21 ENCOUNTER — Emergency Department: Payer: Medicaid Other

## 2014-11-21 DIAGNOSIS — I10 Essential (primary) hypertension: Secondary | ICD-10-CM | POA: Diagnosis not present

## 2014-11-21 DIAGNOSIS — R1011 Right upper quadrant pain: Secondary | ICD-10-CM | POA: Insufficient documentation

## 2014-11-21 DIAGNOSIS — Z79899 Other long term (current) drug therapy: Secondary | ICD-10-CM | POA: Diagnosis not present

## 2014-11-21 DIAGNOSIS — Z720 Tobacco use: Secondary | ICD-10-CM | POA: Insufficient documentation

## 2014-11-21 DIAGNOSIS — Z791 Long term (current) use of non-steroidal anti-inflammatories (NSAID): Secondary | ICD-10-CM | POA: Diagnosis not present

## 2014-11-21 DIAGNOSIS — R079 Chest pain, unspecified: Secondary | ICD-10-CM | POA: Diagnosis present

## 2014-11-21 DIAGNOSIS — R509 Fever, unspecified: Secondary | ICD-10-CM | POA: Insufficient documentation

## 2014-11-21 DIAGNOSIS — R1013 Epigastric pain: Secondary | ICD-10-CM | POA: Diagnosis not present

## 2014-11-21 LAB — CBC
HEMATOCRIT: 36.3 % (ref 35.0–47.0)
Hemoglobin: 12 g/dL (ref 12.0–16.0)
MCH: 27.8 pg (ref 26.0–34.0)
MCHC: 33.1 g/dL (ref 32.0–36.0)
MCV: 83.8 fL (ref 80.0–100.0)
Platelets: 326 10*3/uL (ref 150–440)
RBC: 4.34 MIL/uL (ref 3.80–5.20)
RDW: 15 % — ABNORMAL HIGH (ref 11.5–14.5)
WBC: 9.2 10*3/uL (ref 3.6–11.0)

## 2014-11-21 LAB — BASIC METABOLIC PANEL
ANION GAP: 8 (ref 5–15)
BUN: 10 mg/dL (ref 6–20)
CO2: 30 mmol/L (ref 22–32)
Calcium: 9.8 mg/dL (ref 8.9–10.3)
Chloride: 100 mmol/L — ABNORMAL LOW (ref 101–111)
Creatinine, Ser: 0.99 mg/dL (ref 0.44–1.00)
GFR calc Af Amer: >60 mL/min (ref >60)
Glucose, Bld: 105 mg/dL — ABNORMAL HIGH (ref 65–99)
POTASSIUM: 3.3 mmol/L — AB (ref 3.5–5.1)
SODIUM: 138 mmol/L (ref 135–145)

## 2014-11-21 LAB — TROPONIN I: Troponin I: <0.03 ng/mL (ref <0.031)

## 2014-11-21 LAB — LIPASE, BLOOD: LIPASE: 59 U/L — AB (ref 22–51)

## 2014-11-21 NOTE — ED Notes (Addendum)
Pt says she has been having abdominal pain (around the navel area) that started on Sunday. Pt reports onset of CP about 1 hour ago while reading a magazine. Feels pain from her neck to her lower abdomen that wraps around to her back "my whole body is hurting"

## 2014-11-22 ENCOUNTER — Emergency Department
Admission: EM | Admit: 2014-11-22 | Discharge: 2014-11-22 | Disposition: A | Discharge status: Home / Self Care | Payer: Medicaid Other | Attending: Emergency Medicine | Admitting: Emergency Medicine

## 2014-11-22 ENCOUNTER — Emergency Department: Payer: Medicaid Other

## 2014-11-22 DIAGNOSIS — R1013 Epigastric pain: Secondary | ICD-10-CM

## 2014-11-22 LAB — HEPATIC FUNCTION PANEL
ALT: 27 U/L (ref 14–54)
AST: 50 U/L — AB (ref 15–41)
Albumin: 4 g/dL (ref 3.5–5.0)
Alkaline Phosphatase: 102 U/L (ref 38–126)
Total Bilirubin: 0.4 mg/dL (ref 0.3–1.2)
Total Protein: 7.5 g/dL (ref 6.5–8.1)

## 2014-11-22 MED ORDER — DICYCLOMINE HCL 10 MG/ML IM SOLN
20.0000 mg | Freq: Once | INTRAMUSCULAR | Status: AC
  Administered 2014-11-22: 20 mg via INTRAMUSCULAR
  Filled 2014-11-22: qty 2

## 2014-11-22 MED ORDER — ONDANSETRON HCL 4 MG PO TABS: 4.0000 mg | ORAL_TABLET | Freq: Three times a day (TID) | ORAL | Status: DC | PRN

## 2014-11-22 MED ORDER — DICYCLOMINE HCL 20 MG PO TABS: 20.0000 mg | ORAL_TABLET | Freq: Three times a day (TID) | ORAL | Status: DC | PRN

## 2014-11-22 MED ORDER — ONDANSETRON HCL 4 MG PO TABS: 4.0000 mg | ORAL_TABLET | Freq: Every day | ORAL | Status: DC | PRN

## 2014-11-22 NOTE — Discharge Instructions (Signed)
Please seek medical attention for any high fevers, chest pain, shortness of breath, change in behavior, persistent vomiting, bloody stool or any other new or concerning symptoms. ° ° °Abdominal Pain, Adult °Many things can cause abdominal pain. Usually, abdominal pain is not caused by a disease and will improve without treatment. It can often be observed and treated at home. Your health care provider will do a physical exam and possibly order blood tests and X-rays to help determine the seriousness of your pain. However, in many cases, more time must pass before a clear cause of the pain can be found. Before that point, your health care provider may not know if you need more testing or further treatment. °HOME CARE INSTRUCTIONS °Monitor your abdominal pain for any changes. The following actions may help to alleviate any discomfort you are experiencing: °· Only take over-the-counter or prescription medicines as directed by your health care provider. °· Do not take laxatives unless directed to do so by your health care provider. °· Try a clear liquid diet (broth, tea, or water) as directed by your health care provider. Slowly move to a bland diet as tolerated. °SEEK MEDICAL CARE IF: °· You have unexplained abdominal pain. °· You have abdominal pain associated with nausea or diarrhea. °· You have pain when you urinate or have a bowel movement. °· You experience abdominal pain that wakes you in the night. °· You have abdominal pain that is worsened or improved by eating food. °· You have abdominal pain that is worsened with eating fatty foods. °· You have a fever. °SEEK IMMEDIATE MEDICAL CARE IF: °· Your pain does not go away within 2 hours. °· You keep throwing up (vomiting). °· Your pain is felt only in portions of the abdomen, such as the right side or the left lower portion of the abdomen. °· You pass bloody or black tarry stools. °MAKE SURE YOU: °· Understand these instructions. °· Will watch your  condition. °· Will get help right away if you are not doing well or get worse. °  °This information is not intended to replace advice given to you by your health care provider. Make sure you discuss any questions you have with your health care provider. °  °Document Released: 11/11/2004 Document Revised: 10/23/2014 Document Reviewed: 10/11/2012 °Elsevier Interactive Patient Education ©2016 [employer] ° °

## 2014-11-22 NOTE — ED Notes (Addendum)
Called back to the lab to follow up again on hepatic function panel-  Was told that the the are short handed in the lab.  Joe reported to this RN that the blood test is still sitting on the desk to be ran.

## 2014-11-22 NOTE — ED Notes (Signed)
notified lab Ebony Hail)  to add hepatic function panel to the blood in lab

## 2014-11-22 NOTE — ED Notes (Signed)
Called back to the lab,  He states that they are running the test now.

## 2014-11-22 NOTE — ED Provider Notes (Signed)
[facility] Emergency Department Provider Note    ____________________________________________  Time seen: 0030  I have reviewed the triage vital signs and the nursing notes.   HISTORY  Chief Complaint Abdominal pain  History limited by: Not Limited   HPI Jaime Kidd is a 59 y.o. female who presents to the emergency department today because of concerns for abdominal pain. Patient states she has been having pain for the past 4 days. She states that the pain has been all over her abdomen. She points to the lower abdomen and then the upper abdomen. She states pain is been constant. When she presses it becomes sharp. She has not noticed any change in bowel movements, no diarrhea or black or tarry stools. She has had nausea and feels that asked that is going up her throat but she has had no vomiting. Patient states she has had low-grade fevers. Additionally today she has had some chest and back pain. States that the pain reminds her somewhat when she has had bile duct stones.   Past Medical History  Diagnosis Date  . Hypertension   . GERD (gastroesophageal reflux disease)   . COPD (chronic obstructive pulmonary disease) (HCC)     There are no active problems to display for this patient.   Past Surgical History  Procedure Laterality Date  . Cholecystectomy    . Ercp  12/2012 and 2015    Current Outpatient Rx  Name  Route  Sig  Dispense  Refill  . albuterol (PROVENTIL HFA;VENTOLIN HFA) 108 (90 BASE) MCG/ACT inhaler   Inhalation   Inhale into the lungs every 6 (six) hours as needed for wheezing or shortness of breath.         Marland Kitchen amitriptyline (ELAVIL) 50 MG tablet   Oral   Take 50 mg by mouth at bedtime.         Marland Kitchen lisinopril-hydrochlorothiazide (PRINZIDE,ZESTORETIC) 20-12.5 MG per tablet   Oral   Take 1 tablet by mouth 2 (two) times daily.         . meloxicam (MOBIC) 15 MG tablet   Oral   Take 1 tablet (15 mg total) by mouth daily.   30  tablet   1   . metoprolol (LOPRESSOR) 50 MG tablet   Oral   Take 50 mg by mouth.         Marland Kitchen omeprazole (PRILOSEC) 40 MG capsule   Oral   Take 40 mg by mouth daily.           Allergies Codeine; Wellbutrin; Sulfa antibiotics; Tetracyclines & related; and Trazodone and nefazodone  No family history on file.  Social History Social History  Substance Use Topics  . Smoking status: Current Every Day Smoker -- 1.00 packs/day    Types: Cigarettes  . Smokeless tobacco: None  . Alcohol Use: No    Review of Systems  Constitutional: Positive for fever. Cardiovascular: Negative for chest pain. Respiratory: Negative for shortness of breath. Gastrointestinal: Positive for abdominal pain. Genitourinary: Negative for dysuria. Musculoskeletal: Negative for back pain. Skin: Negative for rash. Neurological: Negative for headaches, focal weakness or numbness.   10-point ROS otherwise negative.  ____________________________________________   PHYSICAL EXAM:  VITAL SIGNS: ED Triage Vitals  Enc Vitals Group     BP 11/21/14 2001 146/66 mmHg     Pulse Rate 11/21/14 2001 74     Resp 11/21/14 2001 18     Temp 11/21/14 2001 98 F (36.7 C)     Temp Source 11/21/14  2001 Oral     SpO2 11/21/14 2001 100 %     Weight 11/21/14 2001 148 lb (67.132 kg)     Height 11/21/14 2001 5\' 5"  (1.651 m)     Head Cir --      Peak Flow --      Pain Score 11/21/14 2002 8   Constitutional: Alert and oriented. Well appearing and in no distress. Eyes: Conjunctivae are normal. PERRL. Normal extraocular movements. ENT   Head: Normocephalic and atraumatic.   Nose: No congestion/rhinnorhea.   Mouth/Throat: Mucous membranes are moist.   Neck: No stridor. Hematological/Lymphatic/Immunilogical: No cervical lymphadenopathy. Cardiovascular: Normal rate, regular rhythm.  No murmurs, rubs, or gallops. Respiratory: Normal respiratory effort without tachypnea nor retractions. Breath sounds are clear  and equal bilaterally. No wheezes/rales/rhonchi. Gastrointestinal: Soft and minimally tender to palpation diffusely, worsening tenderness in the epigastric and right upper quadrant. No rebound. No guarding. Genitourinary: Deferred Musculoskeletal: Normal range of motion in all extremities. No joint effusions.  No lower extremity tenderness nor edema. Neurologic:  Normal speech and language. No gross focal neurologic deficits are appreciated. Speech is normal.  Skin:  Skin is warm, dry and intact. No rash noted. Psychiatric: Mood and affect are normal. Speech and behavior are normal. Patient exhibits appropriate insight and judgment.  ____________________________________________    LABS (pertinent positives/negatives)  Labs Reviewed  BASIC METABOLIC PANEL - Abnormal; Notable for the following:    Potassium 3.3 (*)    Chloride 100 (*)    Glucose, Bld 105 (*)    All other components within normal limits  CBC - Abnormal; Notable for the following:    RDW 15.0 (*)    All other components within normal limits  LIPASE, BLOOD - Abnormal; Notable for the following:    Lipase 59 (*)    All other components within normal limits  TROPONIN I     ____________________________________________   EKG  I, Nance Pear, attending physician, personally viewed and interpreted this EKG  EKG Time: 2000 Rate: 75 Rhythm: NSR Axis: normal Intervals: qtc 408 QRS: narrow ST changes: no st elevation Impression: normal ekg  ____________________________________________    RADIOLOGY  CXR IMPRESSION: No active disease.  I, Janille Draughon, personally viewed and evaluated these images (plain radiographs) as part of my medical decision making. ____________________________________________   PROCEDURES  Procedure(s) performed: None  Critical Care performed: No  ____________________________________________   INITIAL IMPRESSION / ASSESSMENT AND PLAN / ED COURSE  Pertinent labs &  imaging results that were available during my care of the patient were reviewed by me and considered in my medical decision making (see chart for details).  Patient presented to the emergency department today because of abdominal pain. On exam patient with some tenderness to the epigastric and right upper quadrant. Ultrasound shows some stones however no ductal dilatation and hepatic function panel is normal. Minimally elevated lipase. Patient did feel better after Bentyl. Will discharge home with prescription for Bentyl. Discussed return cautions with patient.  ____________________________________________   FINAL CLINICAL IMPRESSION(S) / ED DIAGNOSES  Final diagnoses:  Epigastric pain     Nance Pear, MD 11/22/14 [phone]

## 2014-11-26 ENCOUNTER — Encounter: Payer: Self-pay | Admitting: *Deleted

## 2014-11-27 ENCOUNTER — Encounter
Admission: RE | Disposition: A | Discharge status: Home / Self Care | Payer: Self-pay | Source: Ambulatory Visit | Attending: Gastroenterology

## 2014-11-27 ENCOUNTER — Ambulatory Visit: Payer: Medicaid Other

## 2014-11-27 ENCOUNTER — Encounter: Payer: Self-pay | Admitting: *Deleted

## 2014-11-27 ENCOUNTER — Ambulatory Visit
Admission: RE | Admit: 2014-11-27 | Discharge: 2014-11-27 | Disposition: A | Discharge status: Home / Self Care | Payer: Medicaid Other | Source: Ambulatory Visit | Attending: Gastroenterology | Admitting: Gastroenterology

## 2014-11-27 ENCOUNTER — Ambulatory Visit: Payer: Medicaid Other | Admitting: Certified Registered Nurse Anesthetist

## 2014-11-27 DIAGNOSIS — R109 Unspecified abdominal pain: Secondary | ICD-10-CM | POA: Diagnosis present

## 2014-11-27 DIAGNOSIS — Z881 Allergy status to other antibiotic agents status: Secondary | ICD-10-CM | POA: Insufficient documentation

## 2014-11-27 DIAGNOSIS — Z888 Allergy status to other drugs, medicaments and biological substances status: Secondary | ICD-10-CM | POA: Insufficient documentation

## 2014-11-27 DIAGNOSIS — I10 Essential (primary) hypertension: Secondary | ICD-10-CM | POA: Insufficient documentation

## 2014-11-27 DIAGNOSIS — K219 Gastro-esophageal reflux disease without esophagitis: Secondary | ICD-10-CM | POA: Diagnosis not present

## 2014-11-27 DIAGNOSIS — Z79899 Other long term (current) drug therapy: Secondary | ICD-10-CM | POA: Diagnosis not present

## 2014-11-27 DIAGNOSIS — F17210 Nicotine dependence, cigarettes, uncomplicated: Secondary | ICD-10-CM | POA: Insufficient documentation

## 2014-11-27 DIAGNOSIS — K8050 Calculus of bile duct without cholangitis or cholecystitis without obstruction: Secondary | ICD-10-CM | POA: Diagnosis not present

## 2014-11-27 DIAGNOSIS — J449 Chronic obstructive pulmonary disease, unspecified: Secondary | ICD-10-CM | POA: Insufficient documentation

## 2014-11-27 DIAGNOSIS — Z882 Allergy status to sulfonamides status: Secondary | ICD-10-CM | POA: Diagnosis not present

## 2014-11-27 DIAGNOSIS — Z885 Allergy status to narcotic agent status: Secondary | ICD-10-CM | POA: Insufficient documentation

## 2014-11-27 HISTORY — PX: ERCP: SHX5425

## 2014-11-27 SURGERY — ERCP, WITH INTERVENTION IF INDICATED
Anesthesia: General

## 2014-11-27 MED ORDER — MIDAZOLAM HCL 2 MG/2ML IJ SOLN
INTRAMUSCULAR | Status: DC | PRN
  Administered 2014-11-27: 2 mg via INTRAVENOUS

## 2014-11-27 MED ORDER — PHENYLEPHRINE HCL 10 MG/ML IJ SOLN
INTRAMUSCULAR | Status: DC | PRN
  Administered 2014-11-27: 100 ug via INTRAVENOUS

## 2014-11-27 MED ORDER — SODIUM CHLORIDE 0.9 % IV SOLN
INTRAVENOUS | Status: DC
  Administered 2014-11-27: 1000 mL via INTRAVENOUS

## 2014-11-27 MED ORDER — IPRATROPIUM-ALBUTEROL 0.5-2.5 (3) MG/3ML IN SOLN
RESPIRATORY_TRACT | Status: AC
  Administered 2014-11-27: 14:00:00 via RESPIRATORY_TRACT
  Filled 2014-11-27: qty 3

## 2014-11-27 MED ORDER — GLYCOPYRROLATE 0.2 MG/ML IJ SOLN
INTRAMUSCULAR | Status: DC | PRN
Start: 2014-11-27 — End: 2014-11-27
  Administered 2014-11-27: 0.3 mg via INTRAVENOUS

## 2014-11-27 MED ORDER — FENTANYL CITRATE (PF) 100 MCG/2ML IJ SOLN
INTRAMUSCULAR | Status: DC | PRN
  Administered 2014-11-27: 50 ug via INTRAVENOUS

## 2014-11-27 MED ORDER — PROPOFOL 500 MG/50ML IV EMUL
INTRAVENOUS | Status: DC | PRN
  Administered 2014-11-27: 175 ug/kg/min via INTRAVENOUS

## 2014-11-27 MED ORDER — LIDOCAINE HCL (CARDIAC) 20 MG/ML IV SOLN
INTRAVENOUS | Status: DC | PRN
  Administered 2014-11-27: 100 mg via INTRAVENOUS

## 2014-11-27 MED ORDER — PROPOFOL 10 MG/ML IV BOLUS
INTRAVENOUS | Status: DC | PRN
  Administered 2014-11-27: 60 mg via INTRAVENOUS

## 2014-11-27 NOTE — Anesthesia Preprocedure Evaluation (Signed)
Anesthesia Evaluation  Patient identified by MRN, date of birth, ID band Patient awake    Reviewed: Allergy & Precautions, H&P , NPO status , Patient's Chart, lab work & pertinent test results  History of Anesthesia Complications Negative for: history of anesthetic complications  Airway Mallampati: II  TM Distance: >3 FB Neck ROM: limited    Dental  (+) Poor Dentition, Chipped, Missing, Upper Dentures   Pulmonary neg shortness of breath, COPD, Current Smoker,    Pulmonary exam normal breath sounds clear to auscultation       Cardiovascular Exercise Tolerance: Good hypertension, (-) Past MI negative cardio ROS Normal cardiovascular exam Rhythm:regular Rate:Normal     Neuro/Psych negative neurological ROS  negative psych ROS   GI/Hepatic negative GI ROS, Neg liver ROS, GERD  Controlled,  Endo/Other  negative endocrine ROS  Renal/GU negative Renal ROS  negative genitourinary   Musculoskeletal   Abdominal   Peds  Hematology negative hematology ROS (+)   Anesthesia Other Findings Past Medical History:   Hypertension                                                 GERD (gastroesophageal reflux disease)                       COPD (chronic obstructive pulmonary disease) (*             BMI    Body Mass Index   27.73 kg/m 2      Reproductive/Obstetrics negative OB ROS                             Anesthesia Physical Anesthesia Plan  ASA: III  Anesthesia Plan: General   Post-op Pain Management:    Induction:   Airway Management Planned:   Additional Equipment:   Intra-op Plan:   Post-operative Plan:   Informed Consent: I have reviewed the patients History and Physical, chart, labs and discussed the procedure including the risks, benefits and alternatives for the proposed anesthesia with the patient or authorized representative who has indicated his/her understanding and  acceptance.   Dental Advisory Given  Plan Discussed with: Anesthesiologist, CRNA and Surgeon  Anesthesia Plan Comments:         Anesthesia Quick Evaluation

## 2014-11-27 NOTE — Transfer of Care (Signed)
Immediate Anesthesia Transfer of Care Note  Patient: Jaime Kidd  Procedure(s) Performed: Procedure(s): ENDOSCOPIC RETROGRADE CHOLANGIOPANCREATOGRAPHY (ERCP) (N/A)  Patient Location: PACU  Anesthesia Type:General  Level of Consciousness: awake, alert  and oriented  Airway & Oxygen Therapy: Patient Spontanous Breathing and Patient connected to nasal cannula oxygen  Post-op Assessment: Report given to RN and Post -op Vital signs reviewed and stable  Post vital signs: stable  Last Vitals:  Filed Vitals:   11/27/14 1433  BP: 125/65  Pulse: 109  Temp: 36 C  Resp: 12    Complications: No apparent anesthesia complications

## 2014-11-27 NOTE — H&P (Signed)
Primary Care Physician:  Ellamae Sia, MD Primary Gastroenterologist:  Dr. Candace Cruise  Pre-Procedure History & Physical: HPI:  Jaime Kidd is a 59 y.o. female is here for an ERCP.   Past Medical History  Diagnosis Date  . Hypertension   . GERD (gastroesophageal reflux disease)   . COPD (chronic obstructive pulmonary disease) [facility])     Past Surgical History  Procedure Laterality Date  . Cholecystectomy    . Ercp  12/2012 and 2015    Prior to Admission medications   Medication Sig Start Date End Date Taking? Authorizing Provider  albuterol (PROVENTIL HFA;VENTOLIN HFA) 108 (90 BASE) MCG/ACT inhaler Inhale into the lungs every 6 (six) hours as needed for wheezing or shortness of breath.    Historical Provider, MD  amitriptyline (ELAVIL) 50 MG tablet Take 50 mg by mouth at bedtime.    Historical Provider, MD  dicyclomine (BENTYL) 20 MG tablet Take 1 tablet (20 mg total) by mouth 3 (three) times daily as needed for spasms. 11/22/14 11/22/15  Nance Pear, MD  lisinopril-hydrochlorothiazide (PRINZIDE,ZESTORETIC) 20-12.5 MG per tablet Take 1 tablet by mouth 2 (two) times daily.    Historical Provider, MD  meloxicam (MOBIC) 15 MG tablet Take 1 tablet (15 mg total) by mouth daily. 06/27/14   Frederich Cha, MD  metoprolol (LOPRESSOR) 50 MG tablet Take 50 mg by mouth.    Historical Provider, MD  omeprazole (PRILOSEC) 40 MG capsule Take 40 mg by mouth daily.    Historical Provider, MD  ondansetron (ZOFRAN) 4 MG tablet Take 1 tablet (4 mg total) by mouth daily as needed for nausea or vomiting. 11/22/14   Nance Pear, MD  ondansetron (ZOFRAN) 4 MG tablet Take 1 tablet (4 mg total) by mouth every 8 (eight) hours as needed for nausea or vomiting. 11/22/14   Nance Pear, MD    Allergies as of 11/22/2014 - Review Complete 06/27/2014  Allergen Reaction Noted  . Codeine Nausea And Vomiting 06/27/2014  . Wellbutrin [bupropion] Other (See Comments) 06/27/2014  . Sulfa antibiotics Rash  06/27/2014  . Tetracyclines & related Rash 06/27/2014  . Trazodone and nefazodone Rash 06/27/2014    History reviewed. No pertinent family history.  Social History   Social History  . Marital Status: Married    Spouse Name: N/A  . Number of Children: N/A  . Years of Education: N/A   Occupational History  . Not on file.   Social History Main Topics  . Smoking status: Current Every Day Smoker -- 1.00 packs/day    Types: Cigarettes  . Smokeless tobacco: Never Used  . Alcohol Use: No  . Drug Use: No  . Sexual Activity: Not on file   Other Topics Concern  . Not on file   Social History Narrative    Review of Systems: See HPI, otherwise negative ROS  Physical Exam: There were no vitals taken for this visit. General:   Alert,  pleasant and cooperative in NAD Head:  Normocephalic and atraumatic. Neck:  Supple; no masses or thyromegaly. Lungs:  Clear throughout to auscultation.    Heart:  Regular rate and rhythm. Abdomen:  Soft, nontender and nondistended. Normal bowel sounds, without guarding, and without rebound.   Neurologic:  Alert and  oriented x4;  grossly normal neurologically.  Impression/Plan: Jaime Kidd is here for an ERCP to be performed for Recurrent CBD stones.  Risks, benefits, limitations, and alternatives regarding  ERCP have been reviewed with the patient.  Questions have been answered.  All parties agreeable.   Haydn Hutsell, Lupita Dawn, MD  11/27/2014, 11:18 AM

## 2014-11-27 NOTE — Anesthesia Postprocedure Evaluation (Signed)
  Anesthesia Post-op Note  Patient: Jaime Kidd  Procedure(s) Performed: Procedure(s): ENDOSCOPIC RETROGRADE CHOLANGIOPANCREATOGRAPHY (ERCP) (N/A)  Anesthesia type:General  Patient location: PACU  Post pain: Pain level controlled  Post assessment: Post-op Vital signs reviewed, Patient's Cardiovascular Status Stable, Respiratory Function Stable, Patent Airway and No signs of Nausea or vomiting  Post vital signs: Reviewed and stable  Last Vitals:  Filed Vitals:   11/27/14 1505  BP: 133/76  Pulse: 94  Temp:   Resp: 21    Level of consciousness: awake, alert  and patient cooperative  Complications: No apparent anesthesia complications

## 2014-11-27 NOTE — Op Note (Signed)
[facility] Gastroenterology Patient Name: Jaime Kidd Procedure Date: 11/27/2014 1:23 PM MRN: [id number] Account #: [id number] Date of Birth: [birth year] Admit Type: Outpatient Age: 59 Room: [facility] ENDO ROOM 4 Gender: Female Note Status: Finalized Procedure:         ERCP Indications:       Abdominal pain of suspected biliary origin, Bile duct                     stone on Ultrasound, Recurrent CBD stones. Providers:         Lupita Dawn. Candace Cruise, MD Referring MD:      Ellin Goodie, MD (Referring MD) Medicines:         Monitored Anesthesia Care Complications:     No immediate complications. Procedure:         Pre-Anesthesia Assessment:                    - Prior to the procedure, a History and Physical was                     performed, and patient medications, allergies and                     sensitivities were reviewed. The patient's tolerance of                     previous anesthesia was reviewed.                    - The risks and benefits of the procedure and the sedation                     options and risks were discussed with the patient. All                     questions were answered and informed consent was obtained.                    - After reviewing the risks and benefits, the patient was                     deemed in satisfactory condition to undergo the procedure.                    After obtaining informed consent, the scope was passed                     under direct vision. Throughout the procedure, the                     patient's blood pressure, pulse, and oxygen saturations                     were monitored continuously. The Enteroscope was                     introduced through the mouth, and used to inject contrast                     into and used to inject contrast into the bile duct. The                     ERCP was accomplished without difficulty. The patient  tolerated the procedure well. Findings:      The scout film  was normal. The esophagus was successfully intubated       under direct vision. The scope was advanced to a normal major papilla in       the descending duodenum without detailed examination of the pharynx,       larynx and associated structures, and upper GI tract. The upper GI tract       was grossly normal. Ampulla at left edge of diverticulum.The bile duct       was deeply cannulated with the short-nosed traction sphincterotome.       Contrast was injected. I personally interpreted the bile duct images.       Ductal flow of contrast was adequate. Image quality was adequate.       Contrast extended to the entire biliary tree. The lower third of the       main bile duct contained filling defect(s) thought to be a stone and       sludge. A straight Roadrunner wire was passed into the biliary tree.       Biliary sphincterotomy was made with a monofilament traction (standard)       sphincterotome using ERBE electrocautery. There was no       post-sphincterotomy bleeding. The biliary tree was swept with a 12 mm       balloon starting at the bifurcation. Two stones were removed. No stones       remained. Impression:        - A filling defect consistent with a stone and sludge was                     seen on the cholangiogram.                    - A sphincterotomy was performed.                    - The biliary tree was swept.                    - Choledocholithiasis was found. Complete removal was                     accomplished by biliary sphincterotomy and balloon                     extraction.                    - No specimens collected. Recommendation:    - Discharge patient to home.                    - Observe patient's clinical course.                    - The findings and recommendations were discussed with the                     patient.                    - To prevent recurrent CBD stones, will start pt on                     actigall bid.                    - Return  to GI  clinic in 1 month. Procedure Code(s): --- Professional ---                    [phone], Endoscopic retrograde cholangiopancreatography                     (ERCP); with removal of calculi/debris from                     biliary/pancreatic duct(s)                    43262, Endoscopic retrograde cholangiopancreatography                     (ERCP); with sphincterotomy/papillotomy Diagnosis Code(s): --- Professional ---                    K80.50, Calculus of bile duct without cholangitis or                     cholecystitis without obstruction                    R10.9, Unspecified abdominal pain                    R93.2, Abnormal findings on diagnostic imaging of liver                     and biliary tract CPT copyright 2014 American Medical Association. All rights reserved. The codes documented in this report are preliminary and upon coder review may  be revised to meet current compliance requirements. Hulen Luster, MD 11/27/2014 2:23:24 PM This report has been signed electronically. Number of Addenda: 0 Note Initiated On: 11/27/2014 1:23 PM      [facility]

## 2014-12-04 ENCOUNTER — Encounter: Payer: Self-pay | Admitting: Gastroenterology

## 2015-11-23 ENCOUNTER — Emergency Department: Payer: Medicaid Other

## 2015-11-23 ENCOUNTER — Emergency Department
Admission: EM | Admit: 2015-11-23 | Discharge: 2015-11-23 | Disposition: A | Discharge status: Home / Self Care | Payer: Medicaid Other | Attending: Emergency Medicine | Admitting: Emergency Medicine

## 2015-11-23 DIAGNOSIS — Z79899 Other long term (current) drug therapy: Secondary | ICD-10-CM | POA: Diagnosis not present

## 2015-11-23 DIAGNOSIS — Z791 Long term (current) use of non-steroidal anti-inflammatories (NSAID): Secondary | ICD-10-CM | POA: Diagnosis not present

## 2015-11-23 DIAGNOSIS — F17210 Nicotine dependence, cigarettes, uncomplicated: Secondary | ICD-10-CM | POA: Insufficient documentation

## 2015-11-23 DIAGNOSIS — I10 Essential (primary) hypertension: Secondary | ICD-10-CM | POA: Insufficient documentation

## 2015-11-23 DIAGNOSIS — J449 Chronic obstructive pulmonary disease, unspecified: Secondary | ICD-10-CM | POA: Diagnosis not present

## 2015-11-23 DIAGNOSIS — R079 Chest pain, unspecified: Secondary | ICD-10-CM

## 2015-11-23 LAB — HEPATIC FUNCTION PANEL
ALBUMIN: 3.7 g/dL (ref 3.5–5.0)
ALK PHOS: 74 U/L (ref 38–126)
ALT: 18 U/L (ref 14–54)
AST: 40 U/L (ref 15–41)
BILIRUBIN TOTAL: 0.6 mg/dL (ref 0.3–1.2)
Bilirubin, Direct: <0.1 mg/dL — ABNORMAL LOW (ref 0.1–0.5)
TOTAL PROTEIN: 6.4 g/dL — AB (ref 6.5–8.1)

## 2015-11-23 LAB — CBC
HEMATOCRIT: 33.2 % — AB (ref 35.0–47.0)
HEMOGLOBIN: 11.2 g/dL — AB (ref 12.0–16.0)
MCH: 28.2 pg (ref 26.0–34.0)
MCHC: 33.6 g/dL (ref 32.0–36.0)
MCV: 83.8 fL (ref 80.0–100.0)
PLATELETS: 262 10*3/uL (ref 150–440)
RBC: 3.96 MIL/uL (ref 3.80–5.20)
RDW: 14.8 % — ABNORMAL HIGH (ref 11.5–14.5)
WBC: 6.8 10*3/uL (ref 3.6–11.0)

## 2015-11-23 LAB — BASIC METABOLIC PANEL
Anion gap: 7 (ref 5–15)
BUN: 16 mg/dL (ref 6–20)
CHLORIDE: 104 mmol/L (ref 101–111)
CO2: 26 mmol/L (ref 22–32)
Calcium: 8.9 mg/dL (ref 8.9–10.3)
Creatinine, Ser: 1.08 mg/dL — ABNORMAL HIGH (ref 0.44–1.00)
GFR calc non Af Amer: 54 mL/min — ABNORMAL LOW (ref >60)
Glucose, Bld: 88 mg/dL (ref 65–99)
POTASSIUM: 3.7 mmol/L (ref 3.5–5.1)
SODIUM: 137 mmol/L (ref 135–145)

## 2015-11-23 LAB — TROPONIN I: Troponin I: <0.03 ng/mL (ref <0.03)

## 2015-11-23 LAB — LIPASE, BLOOD: LIPASE: 47 U/L (ref 11–51)

## 2015-11-23 LAB — FIBRIN DERIVATIVES D-DIMER (ARMC ONLY): Fibrin derivatives D-dimer (ARMC): 583 — ABNORMAL HIGH (ref 0–499)

## 2015-11-23 MED ORDER — SUCRALFATE 1 G PO TABS: 1.0000 g | ORAL_TABLET | Freq: Four times a day (QID) | ORAL | 1 refills | Status: DC

## 2015-11-23 MED ORDER — GI COCKTAIL ~~LOC~~
30.0000 mL | Freq: Once | ORAL | Status: AC
  Administered 2015-11-23: 30 mL via ORAL
  Filled 2015-11-23: qty 30

## 2015-11-23 NOTE — Discharge Instructions (Signed)
Please increase your Prilosec to twice a day. Avoid Motrin or related medications as these may irritate the stomach. You can take Tylenol for discomfort. Please contact her primary physician and/or your gastroenterologist for further outpatient follow-up.  Please return immediately if condition worsens. Please contact her primary physician or the physician you were given for referral. If you have any specialist physicians involved in her treatment and plan please also contact them. Thank you for using [facility] emergency [facility].

## 2015-11-23 NOTE — ED Provider Notes (Addendum)
Time Seen: Approximately 1730  I have reviewed the triage notes  Chief Complaint: Chest Pain   History of Present Illness: Jaime Kidd is a 60 y.o. female who states that she has been recovering from some bronchitis and noticed some chest pain earlier today approximately 3 hours prior to arrival. She does have a history of gastroesophageal reflux disease. She denies any cardiac history. Patient's had her gallbladder removed and states she does have some epigastric abdominal pain is where she points and states the pain seems to come up the middle of her chest up toward her neck. She denies any back or flank pain. She denies any arm or jaw pain. She denies any vomiting though she has had some nausea. She denies any shortness of breath or pleuritic component.   Past Medical History:  Diagnosis Date  . COPD (chronic obstructive pulmonary disease) ([place])   . GERD (gastroesophageal reflux disease)   . Hypertension     There are no active problems to display for this patient.   Past Surgical History:  Procedure Laterality Date  . CHOLECYSTECTOMY    . ERCP  12/2012 and 2015  . ERCP N/A 11/27/2014   Procedure: ENDOSCOPIC RETROGRADE CHOLANGIOPANCREATOGRAPHY (ERCP);  Surgeon: Hulen Luster, MD;  Location: [facility] ENDOSCOPY;  Service: Gastroenterology;  Laterality: N/A;    Past Surgical History:  Procedure Laterality Date  . CHOLECYSTECTOMY    . ERCP  12/2012 and 2015  . ERCP N/A 11/27/2014   Procedure: ENDOSCOPIC RETROGRADE CHOLANGIOPANCREATOGRAPHY (ERCP);  Surgeon: Hulen Luster, MD;  Location: [facility] ENDOSCOPY;  Service: Gastroenterology;  Laterality: N/A;    Current Outpatient Rx  . Order #: [id number] Class: Historical Med  . Order #: [id number] Class: Historical Med  . Order #: [id number] Class: Historical Med  . Order #: [id number] Class: Historical Med  . Order #: [id number] Class: Historical Med  . Order #: [id number] Class: Historical Med  . Order #: [id number] Class: Historical Med  . Order  #: [id number] Class: Historical Med    Allergies:  Codeine; Darvon [propoxyphene]; Gabapentin; Wellbutrin [bupropion]; Sulfa antibiotics; Tetracyclines & related; and Trazodone and nefazodone  Family History: No family history on file.  Social History: Social History  Substance Use Topics  . Smoking status: Current Every Day Smoker    Packs/day: 1.00    Types: Cigarettes  . Smokeless tobacco: Never Used  . Alcohol use No     Review of Systems:   10 point review of systems was performed and was otherwise negative:  Constitutional: No fever Eyes: No visual disturbances ENT: No sore throat, ear pain Cardiac: Pain is in the middle of the chest without radiation. Patient states her pain is improving from what had occurred earlier today. Respiratory: No shortness of breath, wheezing, or stridor Abdomen: No abdominal pain, no vomiting, No diarrhea Endocrine: No weight loss, No night sweats Extremities: No peripheral edema, cyanosis Skin: No rashes, easy bruising Neurologic: No focal weakness, trouble with speech or swollowing Urologic: No dysuria, Hematuria, or urinary frequency   Physical Exam:  ED Triage Vitals [11/23/15 1632]  Enc Vitals Group     BP (!) 156/63     Pulse Rate 71     Resp 18     Temp 97.8 F (36.6 C)     Temp Source Oral     SpO2 100 %     Weight 142 lb (64.4 kg)     Height 5' (1.524 m)     Head Circumference      Peak Flow  Pain Score 5     Pain Loc      Pain Edu?      Excl. in [place]?     General: Awake , Alert , and Oriented times 3; GCS 15 Head: Normal cephalic , atraumatic Eyes: Pupils equal , round, reactive to light Nose/Throat: No nasal drainage, patent upper airway without erythema or exudate.  Neck: Supple, Full range of motion, No anterior adenopathy or palpable thyroid masses Lungs: Clear to ascultation without wheezes , rhonchi, or rales Heart: Regular rate, regular rhythm without murmurs , gallops , or rubs Abdomen: Soft, non  tender without rebound, guarding , or rigidity; bowel sounds positive and symmetric in all 4 quadrants. No organomegaly .        Extremities: 2 plus symmetric pulses. No edema, clubbing or cyanosis Neurologic: normal ambulation, Motor symmetric without deficits, sensory intact Skin: warm, dry, no rashes   Labs:   All laboratory work was reviewed including any pertinent negatives or positives listed below:  Labs Reviewed  BASIC METABOLIC PANEL - Abnormal; Notable for the following:       Result Value   Creatinine, Ser 1.08 (*)    GFR calc non Af Amer 54 (*)    All other components within normal limits  CBC - Abnormal; Notable for the following:    Hemoglobin 11.2 (*)    HCT 33.2 (*)    RDW 14.8 (*)    All other components within normal limits  FIBRIN DERIVATIVES D-DIMER ([facility] ONLY) - Abnormal; Notable for the following:    Fibrin derivatives D-dimer ([facility]) 583 (*)    All other components within normal limits  HEPATIC FUNCTION PANEL - Abnormal; Notable for the following:    Total Protein 6.4 (*)    Bilirubin, Direct <0.1 (*)    All other components within normal limits  TROPONIN I  LIPASE, BLOOD  Laboratory work was reviewed and showed no clinically significant abnormalities.   EKG:  ED ECG REPORT I, Daymon Larsen, the attending physician, personally viewed and interpreted this ECG.  Date: 11/23/2015 EKG Time: 1631 Rate:70 Rhythm: normal sinus rhythm QRS Axis: normal Intervals: normal ST/T Wave abnormalities: normal Conduction Disturbances: none Narrative Interpretation: unremarkable Normal EKG  Radiology "Dg Chest 2 View  Result Date: 11/23/2015 CLINICAL DATA:  Mid chest pain extending into the neck today. Smoker. EXAM: CHEST  2 VIEW COMPARISON:  11/21/2014 FINDINGS: The cardiomediastinal silhouette is within normal limits. Mild biapical scarring is unchanged. There is no evidence of acute airspace consolidation, edema, pleural effusion, or pneumothorax. Right upper  quadrant abdominal surgical clips are noted. No acute osseous abnormality is seen. IMPRESSION: No active cardiopulmonary disease. Electronically Signed   By: Logan Bores M.D.   On: 11/23/2015 17:11  "  I personally reviewed the radiologic studies    ED Course:  Differential includes all life-threatening causes for chest pain. This includes but is not exclusive to acute coronary syndrome, aortic dissection, pulmonary embolism, cardiac tamponade, community-acquired pneumonia, pericarditis, musculoskeletal chest wall pain, etc.  Given the patient's current clinical presentation and objective findings I felt this was not a life-threatening cause for chest pain. It sounds like esophageal reflux disease especially with the ascending component and the fact that she has a history of reflux. Pain starts primarily in the epigastric area. I felt was unlikely to be cardiovascular at this time. Patient was given a GI cocktail with symptomatic relief. Clinical Course     Assessment: * Acute unspecified chest pain  Plan:  Outpatient Carafate prescription and increase the Prilosec to twice a day Patient was advised to return immediately if condition worsens. Patient was advised to follow up with their primary care physician or other specialized physicians involved in their outpatient care. The patient and/or family member/power of attorney had laboratory results reviewed at the bedside. All questions and concerns were addressed and appropriate discharge instructions were distributed by the nursing staff.            Daymon Larsen, MD 11/23/15 Paia Quigley, MD 11/23/15 [phone]

## 2015-11-23 NOTE — ED Triage Notes (Addendum)
Pt reports feeling like she was getting over bronchitis yesterday evening and then today c/o chest pain in the center of her chest going up through to her throat. Pt states she had her gallbladder out and has since been having problems. Gallbladder removed within the past 1-2 years.

## 2016-03-02 ENCOUNTER — Other Ambulatory Visit: Payer: Self-pay | Admitting: Gastroenterology

## 2016-03-02 DIAGNOSIS — K8050 Calculus of bile duct without cholangitis or cholecystitis without obstruction: Secondary | ICD-10-CM

## 2016-03-02 DIAGNOSIS — R1010 Upper abdominal pain, unspecified: Secondary | ICD-10-CM

## 2016-03-02 DIAGNOSIS — R1310 Dysphagia, unspecified: Secondary | ICD-10-CM

## 2016-03-02 DIAGNOSIS — K219 Gastro-esophageal reflux disease without esophagitis: Secondary | ICD-10-CM

## 2016-03-08 ENCOUNTER — Other Ambulatory Visit: Payer: Self-pay | Admitting: Gastroenterology

## 2016-03-08 DIAGNOSIS — R1011 Right upper quadrant pain: Secondary | ICD-10-CM

## 2016-04-05 ENCOUNTER — Ambulatory Visit: Payer: Medicaid Other

## 2016-04-05 ENCOUNTER — Other Ambulatory Visit: Payer: Medicaid Other

## 2016-05-13 ENCOUNTER — Ambulatory Visit: Admission: RE | Admit: 2016-05-13 | Payer: Medicaid Other | Source: Ambulatory Visit

## 2016-05-13 ENCOUNTER — Ambulatory Visit: Payer: Medicaid Other

## 2016-06-17 ENCOUNTER — Other Ambulatory Visit: Payer: Self-pay | Admitting: Family Medicine

## 2016-06-17 ENCOUNTER — Ambulatory Visit
Admission: RE | Admit: 2016-06-17 | Discharge: 2016-06-17 | Disposition: A | Discharge status: Home / Self Care | Payer: Medicaid Other | Source: Ambulatory Visit | Attending: Gastroenterology | Admitting: Gastroenterology

## 2016-06-17 DIAGNOSIS — K8050 Calculus of bile duct without cholangitis or cholecystitis without obstruction: Secondary | ICD-10-CM | POA: Diagnosis not present

## 2016-06-17 DIAGNOSIS — Z9889 Other specified postprocedural states: Secondary | ICD-10-CM | POA: Insufficient documentation

## 2016-06-17 DIAGNOSIS — K449 Diaphragmatic hernia without obstruction or gangrene: Secondary | ICD-10-CM | POA: Insufficient documentation

## 2016-06-17 DIAGNOSIS — K219 Gastro-esophageal reflux disease without esophagitis: Secondary | ICD-10-CM | POA: Diagnosis not present

## 2016-06-17 DIAGNOSIS — Z9049 Acquired absence of other specified parts of digestive tract: Secondary | ICD-10-CM | POA: Insufficient documentation

## 2016-06-17 DIAGNOSIS — R1310 Dysphagia, unspecified: Secondary | ICD-10-CM | POA: Insufficient documentation

## 2016-06-17 DIAGNOSIS — R1011 Right upper quadrant pain: Secondary | ICD-10-CM | POA: Diagnosis not present

## 2016-06-17 DIAGNOSIS — R1010 Upper abdominal pain, unspecified: Secondary | ICD-10-CM

## 2016-06-18 ENCOUNTER — Encounter: Payer: Self-pay | Admitting: *Deleted

## 2016-06-21 ENCOUNTER — Other Ambulatory Visit: Payer: Self-pay | Admitting: Family Medicine

## 2016-06-21 ENCOUNTER — Ambulatory Visit: Payer: Medicaid Other | Admitting: Anesthesiology

## 2016-06-21 ENCOUNTER — Encounter: Payer: Self-pay | Admitting: *Deleted

## 2016-06-21 ENCOUNTER — Ambulatory Visit
Admission: RE | Admit: 2016-06-21 | Discharge: 2016-06-21 | Disposition: A | Discharge status: Home / Self Care | Payer: Medicaid Other | Source: Ambulatory Visit | Attending: Gastroenterology | Admitting: Gastroenterology

## 2016-06-21 ENCOUNTER — Encounter
Admission: RE | Disposition: A | Discharge status: Home / Self Care | Payer: Self-pay | Source: Ambulatory Visit | Attending: Gastroenterology

## 2016-06-21 DIAGNOSIS — F17210 Nicotine dependence, cigarettes, uncomplicated: Secondary | ICD-10-CM | POA: Insufficient documentation

## 2016-06-21 DIAGNOSIS — F329 Major depressive disorder, single episode, unspecified: Secondary | ICD-10-CM | POA: Diagnosis not present

## 2016-06-21 DIAGNOSIS — K228 Other specified diseases of esophagus: Secondary | ICD-10-CM | POA: Insufficient documentation

## 2016-06-21 DIAGNOSIS — M1990 Unspecified osteoarthritis, unspecified site: Secondary | ICD-10-CM | POA: Diagnosis not present

## 2016-06-21 DIAGNOSIS — J449 Chronic obstructive pulmonary disease, unspecified: Secondary | ICD-10-CM | POA: Diagnosis not present

## 2016-06-21 DIAGNOSIS — Z79899 Other long term (current) drug therapy: Secondary | ICD-10-CM | POA: Diagnosis not present

## 2016-06-21 DIAGNOSIS — Z882 Allergy status to sulfonamides status: Secondary | ICD-10-CM | POA: Insufficient documentation

## 2016-06-21 DIAGNOSIS — K224 Dyskinesia of esophagus: Secondary | ICD-10-CM | POA: Diagnosis not present

## 2016-06-21 DIAGNOSIS — Z7951 Long term (current) use of inhaled steroids: Secondary | ICD-10-CM | POA: Diagnosis not present

## 2016-06-21 DIAGNOSIS — K2950 Unspecified chronic gastritis without bleeding: Secondary | ICD-10-CM | POA: Insufficient documentation

## 2016-06-21 DIAGNOSIS — R1011 Right upper quadrant pain: Secondary | ICD-10-CM | POA: Diagnosis present

## 2016-06-21 DIAGNOSIS — R7303 Prediabetes: Secondary | ICD-10-CM | POA: Diagnosis not present

## 2016-06-21 DIAGNOSIS — K3189 Other diseases of stomach and duodenum: Secondary | ICD-10-CM | POA: Insufficient documentation

## 2016-06-21 DIAGNOSIS — K2980 Duodenitis without bleeding: Secondary | ICD-10-CM | POA: Insufficient documentation

## 2016-06-21 DIAGNOSIS — K219 Gastro-esophageal reflux disease without esophagitis: Secondary | ICD-10-CM | POA: Insufficient documentation

## 2016-06-21 DIAGNOSIS — M797 Fibromyalgia: Secondary | ICD-10-CM | POA: Diagnosis not present

## 2016-06-21 DIAGNOSIS — I10 Essential (primary) hypertension: Secondary | ICD-10-CM | POA: Insufficient documentation

## 2016-06-21 HISTORY — DX: Prediabetes: R73.03

## 2016-06-21 HISTORY — PX: ESOPHAGOGASTRODUODENOSCOPY (EGD) WITH PROPOFOL: SHX5813

## 2016-06-21 HISTORY — DX: Duodenitis without bleeding: K29.80

## 2016-06-21 HISTORY — DX: Personal history of other endocrine, nutritional and metabolic disease: Z86.39

## 2016-06-21 HISTORY — DX: Calculus of bile duct without cholangitis or cholecystitis without obstruction: K80.50

## 2016-06-21 HISTORY — DX: Headache: R51

## 2016-06-21 HISTORY — DX: Major depressive disorder, single episode, unspecified: F32.9

## 2016-06-21 HISTORY — DX: Depression, unspecified: F32.A

## 2016-06-21 HISTORY — DX: Fibromyalgia: M79.7

## 2016-06-21 HISTORY — DX: Headache, unspecified: R51.9

## 2016-06-21 HISTORY — DX: Unspecified osteoarthritis, unspecified site: M19.90

## 2016-06-21 SURGERY — ESOPHAGOGASTRODUODENOSCOPY (EGD) WITH PROPOFOL
Anesthesia: General

## 2016-06-21 MED ORDER — MIDAZOLAM HCL 2 MG/2ML IJ SOLN
INTRAMUSCULAR | Status: AC
  Filled 2016-06-21: qty 2

## 2016-06-21 MED ORDER — LIDOCAINE HCL (CARDIAC) 20 MG/ML IV SOLN
INTRAVENOUS | Status: DC | PRN
  Administered 2016-06-21: 30 mg via INTRAVENOUS

## 2016-06-21 MED ORDER — FENTANYL CITRATE (PF) 100 MCG/2ML IJ SOLN
INTRAMUSCULAR | Status: DC | PRN
  Administered 2016-06-21 (×2): 50 ug via INTRAVENOUS

## 2016-06-21 MED ORDER — PROPOFOL 500 MG/50ML IV EMUL
INTRAVENOUS | Status: AC
  Filled 2016-06-21: qty 50

## 2016-06-21 MED ORDER — IPRATROPIUM-ALBUTEROL 0.5-2.5 (3) MG/3ML IN SOLN
3.0000 mL | Freq: Once | RESPIRATORY_TRACT | Status: AC
  Administered 2016-06-21: 3 mL via RESPIRATORY_TRACT
  Filled 2016-06-21: qty 3

## 2016-06-21 MED ORDER — SODIUM CHLORIDE 0.9 % IV SOLN: INTRAVENOUS | Status: DC

## 2016-06-21 MED ORDER — FENTANYL CITRATE (PF) 100 MCG/2ML IJ SOLN
INTRAMUSCULAR | Status: AC
  Filled 2016-06-21: qty 2

## 2016-06-21 MED ORDER — SODIUM CHLORIDE 0.9 % IV SOLN
INTRAVENOUS | Status: DC
  Administered 2016-06-21: 11:00:00 via INTRAVENOUS

## 2016-06-21 MED ORDER — MIDAZOLAM HCL 2 MG/2ML IJ SOLN
INTRAMUSCULAR | Status: DC | PRN
  Administered 2016-06-21: 2 mg via INTRAVENOUS

## 2016-06-21 MED ORDER — PROPOFOL 500 MG/50ML IV EMUL
INTRAVENOUS | Status: DC | PRN
  Administered 2016-06-21: 120 ug/kg/min via INTRAVENOUS

## 2016-06-21 NOTE — H&P (Signed)
Outpatient short stay form Pre-procedure 06/21/2016 11:28 AM Jaime Sails MD  Primary Physician: Dr. Arrie Aran  Reason for visit:  EGD  History of present illness:  Jaime Kidd is a 61 year old female presenting today as above. She has a history of epigastric discomfort, right and left upper quadrant pain. As well as a generalized abdominal discomfort. She has had her gallbladder removed she did have a ERCP. She was in the emergency room several months ago and did have evidence of air in the bile duct however no recurrent stones at that time. She apparently did have multiple recurrent stones after her gallbladder surgery necessitating ERCP. She has some occasional dysphagia but does not regurgitate foods. She had a barium swallow done on 06/17/2016, this showing no evidence of a fixed stricture. She did have multiple tertiary contractions consistent with presbyesophagus. Barium tablet promptly passed into the stomach. It is of note on evaluation this looks much likely marked presbyesophagus. Jaime Kidd does take NSAIDs. She had been on occasional twice a day omeprazole which was switched to pantoprazole. Jaime Kidd stated that this did not seem to help however she had been taking it in the evening after meal. I explained the rationale for proper timing of taking this medication.    Current Facility-Administered Medications:  .  0.9 %  sodium chloride infusion, , Intravenous, Continuous, Jaime Sails, MD, Last Rate: 20 mL/hr at 06/21/16 1050 .  0.9 %  sodium chloride infusion, , Intravenous, Continuous, Jaime Sails, MD  Facility-Administered Medications Ordered in Other Encounters:  .  fentaNYL (SUBLIMAZE) injection, , , Anesthesia Intra-op, Cook-Verneda Hollopeter, Cindy, 50 mcg at 06/21/16 1127 .  midazolam (VERSED) injection, , , Anesthesia Intra-op, Cook-Farley Crooker, Cindy, 2 mg at 06/21/16 1127  Prescriptions Prior to Admission  Medication Sig Dispense Refill Last Dose  . albuterol (PROVENTIL  HFA;VENTOLIN HFA) 108 (90 BASE) MCG/ACT inhaler Inhale into the lungs every 6 (six) hours as needed for wheezing or shortness of breath.   Past Week at Unknown time  . amitriptyline (ELAVIL) 50 MG tablet Take 50 mg by mouth at bedtime.   06/20/2016 at 2100  . atorvastatin (LIPITOR) 20 MG tablet Take 20 mg by mouth daily.   06/20/2016 at 2100  . DULoxetine (CYMBALTA) 60 MG capsule Take 60 mg by mouth daily.   06/20/2016 at 0800  . losartan-hydrochlorothiazide (HYZAAR) 100-25 MG tablet Take 1 tablet by mouth daily.   06/20/2016 at 0800  . methocarbamol (ROBAXIN) 750 MG tablet Take 750 mg by mouth 2 (two) times daily.   Past Month at Unknown time  . metoprolol (LOPRESSOR) 50 MG tablet Take 50 mg by mouth 2 (two) times daily.    06/20/2016 at 2100  . naproxen (NAPROSYN) 500 MG tablet Take 500 mg by mouth 2 (two) times daily with a meal.   Past Week at Unknown time  . ondansetron (ZOFRAN-ODT) 4 MG disintegrating tablet Take 4 mg by mouth every 8 (eight) hours as needed for nausea or vomiting.     . pantoprazole (PROTONIX) 40 MG tablet Take 40 mg by mouth daily.   06/20/2016 at 2100  . sucralfate (CARAFATE) 1 g tablet Take 1 tablet (1 g total) by mouth 4 (four) times daily. (Jaime Kidd not taking: Reported on 06/21/2016) 120 tablet 1 Not Taking at Unknown time     Allergies  Allergen Reactions  . Codeine Nausea And Vomiting  . Darvon [Propoxyphene] Nausea And Vomiting  . Gabapentin Other (See Comments)    Hallucination   . Lisinopril   .  Wellbutrin [Bupropion] Other (See Comments)    "Makes me mean"  . Sulfa Antibiotics Rash  . Tetracyclines & Related Rash  . Trazodone And Nefazodone Rash     Past Medical History:  Diagnosis Date  . Arthritis   . Choledocholithiasis   . COPD (chronic obstructive pulmonary disease) ([place])   . Depression   . Fibromyalgia   . GERD (gastroesophageal reflux disease)   . Headache   . Hx of elevated lipids   . Hypertension   . Pre-diabetes     Review of systems:       Physical Exam    Heart and lungs: Regular rate and rhythm without rub or gallop, lungs are bilaterally clear.    HEENT: Normocephalic atraumatic eyes are anicteric    Other:     Pertinant exam for procedure: Soft mild diffuse tenderness to palpation more so noted in the epigastric region and right upper quadrant. No masses rebound or organomegaly.    Planned proceedures: EGD and indicated procedures. I have discussed the risks benefits and complications of procedures to include not limited to bleeding, infection, perforation and the risk of sedation and the Jaime Kidd wishes to proceed.    Jaime Sails, MD Gastroenterology 06/21/2016  11:28 AM

## 2016-06-21 NOTE — Anesthesia Procedure Notes (Signed)
Performed by: COOK-MARTIN, Deshawna Mcneece Pre-anesthesia Checklist: Patient identified, Emergency Drugs available, Suction available, Patient being monitored and Timeout performed Patient Re-evaluated:Patient Re-evaluated prior to inductionOxygen Delivery Method: Nasal cannula Preoxygenation: Pre-oxygenation with 100% oxygen Intubation Type: IV induction Airway Equipment and Method: Bite block Placement Confirmation: CO2 detector and positive ETCO2     

## 2016-06-21 NOTE — Anesthesia Preprocedure Evaluation (Signed)
Anesthesia Evaluation  Patient identified by MRN, date of birth, ID band Patient awake    Reviewed: Allergy & Precautions, H&P , NPO status , Patient's Chart, lab work & pertinent test results  History of Anesthesia Complications Negative for: history of anesthetic complications  Airway Mallampati: III  TM Distance: <3 FB Neck ROM: limited    Dental  (+) Poor Dentition, Chipped, Missing, Partial Lower, Partial Upper   Pulmonary shortness of breath and with exertion, COPD,  COPD inhaler, Current Smoker,    Pulmonary exam normal breath sounds clear to auscultation       Cardiovascular Exercise Tolerance: Good hypertension, (-) anginaNormal cardiovascular exam Rhythm:regular Rate:Normal     Neuro/Psych  Headaches, PSYCHIATRIC DISORDERS negative neurological ROS     GI/Hepatic negative GI ROS, Neg liver ROS, GERD  Controlled and Medicated,  Endo/Other  negative endocrine ROS  Renal/GU negative Renal ROS  negative genitourinary   Musculoskeletal  (+) Arthritis , Fibromyalgia -  Abdominal   Peds  Hematology negative hematology ROS (+)   Anesthesia Other Findings Past Medical History: No date: Arthritis No date: Choledocholithiasis No date: COPD (chronic obstructive pulmonary disease) (* No date: Depression No date: Fibromyalgia No date: GERD (gastroesophageal reflux disease) No date: Headache No date: Hx of elevated lipids No date: Hypertension No date: Pre-diabetes  Past Surgical History: No date: CESAREAN SECTION No date: CHOLECYSTECTOMY 12/2012 and 2015: ERCP 11/27/2014: ERCP N/A     Comment: Procedure: ENDOSCOPIC RETROGRADE               CHOLANGIOPANCREATOGRAPHY (ERCP);  Surgeon: Hulen Luster, MD;  Location: [facility] ENDOSCOPY;  Service:               Gastroenterology;  Laterality: N/A;     Reproductive/Obstetrics negative OB ROS                             Anesthesia  Physical Anesthesia Plan  ASA: III  Anesthesia Plan: General   Post-op Pain Management:    Induction: Intravenous  Airway Management Planned: Natural Airway and Nasal Cannula  Additional Equipment:   Intra-op Plan:   Post-operative Plan:   Informed Consent: I have reviewed the patients History and Physical, chart, labs and discussed the procedure including the risks, benefits and alternatives for the proposed anesthesia with the patient or authorized representative who has indicated his/her understanding and acceptance.   Dental Advisory Given  Plan Discussed with: Anesthesiologist, CRNA and Surgeon  Anesthesia Plan Comments: (Patient consented for risks of anesthesia including but not limited to:  - adverse reactions to medications - damage to teeth, lips or other oral mucosa - sore throat or hoarseness - Damage to heart, brain, lungs or loss of life  Patient voiced understanding.)        Anesthesia Quick Evaluation

## 2016-06-21 NOTE — Transfer of Care (Signed)
Immediate Anesthesia Transfer of Care Note  Patient: Jaime Kidd  Procedure(s) Performed: Procedure(s): ESOPHAGOGASTRODUODENOSCOPY (EGD) WITH PROPOFOL (N/A)  Patient Location: PACU  Anesthesia Type:General  Level of Consciousness: awake and sedated  Airway & Oxygen Therapy: Patient Spontanous Breathing and Patient connected to nasal cannula oxygen  Post-op Assessment: Report given to RN and Post -op Vital signs reviewed and stable  Post vital signs: Reviewed and stable  Last Vitals:  Vitals:   06/21/16 1026  BP: (!) 155/70  Pulse: 66  Resp: 14  Temp: 36.6 C    Last Pain:  Vitals:   06/21/16 1026  TempSrc: Tympanic         Complications: No apparent anesthesia complications

## 2016-06-21 NOTE — Anesthesia Post-op Follow-up Note (Cosign Needed)
Anesthesia QCDR form completed.        

## 2016-06-21 NOTE — Op Note (Signed)
[facility] Gastroenterology Patient Name: Jaime Kidd Procedure Date: 06/21/2016 11:26 AM MRN: [id number] Account #: [id number] Date of Birth: 10/27/1955 Admit Type: Outpatient Age: 61 Room: [facility] Jaime Kidd ENDO ROOM 3 Gender: Female Note Status: Finalized Procedure:            Upper GI endoscopy Indications:          Epigastric abdominal pain, Abdominal pain in the right                        upper quadrant, Abdominal pain in the left upper                        quadrant, Generalized abdominal pain, Dysphagia,                        Gastro-esophageal reflux disease Providers:            Lollie Sails, MD Referring MD:         No Local Md, MD (Referring MD) Medicines:            Monitored Anesthesia Care Complications:        No immediate complications. Procedure:            Pre-Anesthesia Assessment:                       - ASA Grade Assessment: III - A patient with severe                        systemic disease.                       After obtaining informed consent, the endoscope was                        passed under direct vision. Throughout the procedure,                        the patient's blood pressure, pulse, and oxygen                        saturations were monitored continuously. The Endoscope                        was introduced through the mouth, and advanced to the                        third part of duodenum. The upper GI endoscopy was                        accomplished without difficulty. The patient tolerated                        the procedure well. Findings:      The Z-line was variable. Biopsies were taken with a cold forceps for       histology.      Abnormal motility was noted in the middle third of the esophagus and in       the lower third of the esophagus. The cricopharyngeus was normal. There       is spasticity and extra peristaltic waves of the esophageal body. The  distal esophagus/lower esophageal sphincter is spastic,  but gives up       passage to the endoscope. Tertiary peristaltic waves are noted. No fixed       stricture or stenosis noted.      Patchy mildly erythematous mucosa without bleeding was found in the       gastric fundus and in the gastric body. Biopsies were taken with a cold       forceps for histology. Biopsies were taken with a cold forceps for       Helicobacter pylori testing.      Patchy mild inflammation characterized by erosions, erythema and       granularity was found in the duodenal bulb and in the second portion of       the duodenum. Impression:           - Z-line variable. Biopsied.                       - Abnormal esophageal motility, consistent with                        presbyesophagus.                       - Erythematous mucosa in the gastric fundus and gastric                        body. Biopsied.                       - Erosive duodenitis. Recommendation:       - Use Protonix (pantoprazole) 40 mg PO daily daily,                        take 35-45 minutes before breakfast.                       - Use sucralfate tablets 1 gram PO QID for 6 weeks.                       - Use sucralfate tablets 1 gram PO BID daily.                       - Return to GI clinic in 4 weeks. Procedure Code(s):    --- Professional ---                       [phone], Esophagogastroduodenoscopy, flexible, transoral;                        with biopsy, single or multiple Diagnosis Code(s):    --- Professional ---                       K22.8, Other specified diseases of esophagus                       K22.4, Dyskinesia of esophagus                       K31.89, Other diseases of stomach and duodenum                       K29.80, Duodenitis  without bleeding                       R10.13, Epigastric pain                       R10.11, Right upper quadrant pain                       R10.12, Left upper quadrant pain                       R10.84, Generalized abdominal pain                        R13.10, Dysphagia, unspecified                       K21.9, Gastro-esophageal reflux disease without                        esophagitis CPT copyright 2016 [employer]. All rights reserved. The codes documented in this report are preliminary and upon coder review may  be revised to meet current compliance requirements. Lollie Sails, MD 06/21/2016 11:53:50 AM This report has been signed electronically. Number of Addenda: 0 Note Initiated On: 06/21/2016 11:26 AM      [facility]

## 2016-06-21 NOTE — H&P (Signed)
Outpatient short stay form Pre-procedure 06/21/2016 10:57 AM Jaime Sails MD  Primary Physician: Dr. Arrie Aran  Reason for visit:  EGD  History of present illness:  Jaime Kidd is a 61 year old Jaime Kidd presenting today as above.    Current Facility-Administered Medications:  .  0.9 %  sodium chloride infusion, , Intravenous, Continuous, Jaime Sails, MD, Last Rate: 20 mL/hr at 06/21/16 1050 .  0.9 %  sodium chloride infusion, , Intravenous, Continuous, Jaime Sails, MD  Prescriptions Prior to Admission  Medication Sig Dispense Refill Last Dose  . albuterol (PROVENTIL HFA;VENTOLIN HFA) 108 (90 BASE) MCG/ACT inhaler Inhale into the lungs every 6 (six) hours as needed for wheezing or shortness of breath.   Past Week at Unknown time  . amitriptyline (ELAVIL) 50 MG tablet Take 50 mg by mouth at bedtime.   06/20/2016 at 2100  . atorvastatin (LIPITOR) 20 MG tablet Take 20 mg by mouth daily.   06/20/2016 at 2100  . DULoxetine (CYMBALTA) 60 MG capsule Take 60 mg by mouth daily.   06/20/2016 at 0800  . losartan-hydrochlorothiazide (HYZAAR) 100-25 MG tablet Take 1 tablet by mouth daily.   06/20/2016 at 0800  . methocarbamol (ROBAXIN) 750 MG tablet Take 750 mg by mouth 2 (two) times daily.   Past Month at Unknown time  . metoprolol (LOPRESSOR) 50 MG tablet Take 50 mg by mouth 2 (two) times daily.    06/20/2016 at 2100  . naproxen (NAPROSYN) 500 MG tablet Take 500 mg by mouth 2 (two) times daily with a meal.   Past Week at Unknown time  . ondansetron (ZOFRAN-ODT) 4 MG disintegrating tablet Take 4 mg by mouth every 8 (eight) hours as needed for nausea or vomiting.     . pantoprazole (PROTONIX) 40 MG tablet Take 40 mg by mouth daily.   06/20/2016 at 2100  . sucralfate (CARAFATE) 1 g tablet Take 1 tablet (1 g total) by mouth 4 (four) times daily. (Jaime Kidd not taking: Reported on 06/21/2016) 120 tablet 1 Not Taking at Unknown time     Allergies  Allergen Reactions  . Codeine Nausea And Vomiting   . Darvon [Propoxyphene] Nausea And Vomiting  . Gabapentin Other (See Comments)    Hallucination   . Lisinopril   . Wellbutrin [Bupropion] Other (See Comments)    "Makes me mean"  . Sulfa Antibiotics Rash  . Tetracyclines & Related Rash  . Trazodone And Nefazodone Rash     Past Medical History:  Diagnosis Date  . Arthritis   . Choledocholithiasis   . COPD (chronic obstructive pulmonary disease) ([place])   . Depression   . Fibromyalgia   . GERD (gastroesophageal reflux disease)   . Headache   . Hx of elevated lipids   . Hypertension   . Pre-diabetes     Review of systems:      Physical Exam    Heart and lungs:     HEENT:     Other:     Pertinant exam for procedure:     Planned proceedures:     Jaime Sails, MD Gastroenterology 06/21/2016  10:57 AM

## 2016-06-22 ENCOUNTER — Encounter: Payer: Self-pay | Admitting: Gastroenterology

## 2016-06-22 LAB — SURGICAL PATHOLOGY

## 2016-06-22 NOTE — Anesthesia Postprocedure Evaluation (Signed)
Anesthesia Post Note  Patient: TASHEEMA PERRONE  Procedure(s) Performed: Procedure(s) (LRB): ESOPHAGOGASTRODUODENOSCOPY (EGD) WITH PROPOFOL (N/A)  Patient location during evaluation: Endoscopy Anesthesia Type: General Level of consciousness: awake and alert Pain management: pain level controlled Vital Signs Assessment: post-procedure vital signs reviewed and stable Respiratory status: spontaneous breathing, nonlabored ventilation, respiratory function stable and patient connected to nasal cannula oxygen Cardiovascular status: blood pressure returned to baseline and stable Postop Assessment: no signs of nausea or vomiting Anesthetic complications: no     Last Vitals:  Vitals:   06/21/16 1213 06/21/16 1223  BP: (!) 143/75 (!) 154/87  Pulse: 78 76  Resp: 18 (!) 24  Temp:      Last Pain:  Vitals:   06/21/16 1153  TempSrc: Tympanic                 Precious Haws Piscitello

## 2016-09-13 ENCOUNTER — Emergency Department: Payer: Medicaid Other

## 2016-09-13 ENCOUNTER — Emergency Department
Admission: EM | Admit: 2016-09-13 | Discharge: 2016-09-13 | Disposition: A | Discharge status: Home / Self Care | Payer: Medicaid Other | Attending: Emergency Medicine | Admitting: Emergency Medicine

## 2016-09-13 DIAGNOSIS — W19XXXA Unspecified fall, initial encounter: Secondary | ICD-10-CM | POA: Insufficient documentation

## 2016-09-13 DIAGNOSIS — J449 Chronic obstructive pulmonary disease, unspecified: Secondary | ICD-10-CM | POA: Diagnosis not present

## 2016-09-13 DIAGNOSIS — Y929 Unspecified place or not applicable: Secondary | ICD-10-CM | POA: Insufficient documentation

## 2016-09-13 DIAGNOSIS — F17210 Nicotine dependence, cigarettes, uncomplicated: Secondary | ICD-10-CM | POA: Diagnosis not present

## 2016-09-13 DIAGNOSIS — I10 Essential (primary) hypertension: Secondary | ICD-10-CM | POA: Diagnosis not present

## 2016-09-13 DIAGNOSIS — S2242XA Multiple fractures of ribs, left side, initial encounter for closed fracture: Secondary | ICD-10-CM | POA: Insufficient documentation

## 2016-09-13 DIAGNOSIS — Y999 Unspecified external cause status: Secondary | ICD-10-CM | POA: Insufficient documentation

## 2016-09-13 DIAGNOSIS — Y939 Activity, unspecified: Secondary | ICD-10-CM | POA: Insufficient documentation

## 2016-09-13 DIAGNOSIS — Z79899 Other long term (current) drug therapy: Secondary | ICD-10-CM | POA: Insufficient documentation

## 2016-09-13 DIAGNOSIS — R0789 Other chest pain: Secondary | ICD-10-CM | POA: Diagnosis present

## 2016-09-13 MED ORDER — OXYCODONE-ACETAMINOPHEN 5-325 MG PO TABS
1.0000 | ORAL_TABLET | Freq: Once | ORAL | Status: AC
  Administered 2016-09-13: 1 via ORAL
  Filled 2016-09-13: qty 1

## 2016-09-13 MED ORDER — AZITHROMYCIN 250 MG PO TABS: ORAL_TABLET | ORAL | 0 refills | Status: DC

## 2016-09-13 MED ORDER — OXYCODONE-ACETAMINOPHEN 5-325 MG PO TABS: 1.0000 | ORAL_TABLET | Freq: Four times a day (QID) | ORAL | 0 refills | Status: DC | PRN

## 2016-09-13 NOTE — ED Provider Notes (Signed)
[facility] Emergency Department Provider Note  ____________________________________________   First MD Initiated Contact with Patient 09/13/16 [phone]     (approximate)  I have reviewed the triage vital signs and the nursing notes.   HISTORY  Chief Complaint Fall and Rib Injury   HPI Jaime Kidd is a 61 y.o. female is here complaining of left lateral rib pain. Patient states that she fell a week ago Sunday and then again last Wednesday. She denies hitting her head both times. She is continued to have pain in her left ribs since she fell. Patient states that she has to bed toppers on her bed which caused her to fall. She is taking over-the-counter medication without any relief. Currently she rates her pain as 10 over 10. Patient continues to smoke one pack cigarettes per day.   Past Medical History:  Diagnosis Date  . Arthritis   . Choledocholithiasis   . COPD (chronic obstructive pulmonary disease) ([place])   . Depression   . Fibromyalgia   . GERD (gastroesophageal reflux disease)   . Headache   . Hx of elevated lipids   . Hypertension   . Pre-diabetes     There are no active problems to display for this patient.   Past Surgical History:  Procedure Laterality Date  . CESAREAN SECTION    . CHOLECYSTECTOMY    . ERCP  12/2012 and 2015  . ERCP N/A 11/27/2014   Procedure: ENDOSCOPIC RETROGRADE CHOLANGIOPANCREATOGRAPHY (ERCP);  Surgeon: Hulen Luster, MD;  Location: [facility] ENDOSCOPY;  Service: Gastroenterology;  Laterality: N/A;  . ESOPHAGOGASTRODUODENOSCOPY (EGD) WITH PROPOFOL N/A 06/21/2016   Procedure: ESOPHAGOGASTRODUODENOSCOPY (EGD) WITH PROPOFOL;  Surgeon: Lollie Sails, MD;  Location: [facility] ENDOSCOPY;  Service: Endoscopy;  Laterality: N/A;    Prior to Admission medications   Medication Sig Start Date End Date Taking? Authorizing Provider  esomeprazole (NEXIUM) 40 MG capsule Take 40 mg by mouth daily at 12 noon.   Yes [provider]    albuterol (PROVENTIL HFA;VENTOLIN HFA) 108 (90 BASE) MCG/ACT inhaler Inhale into the lungs every 6 (six) hours as needed for wheezing or shortness of breath.    [provider]  amitriptyline (ELAVIL) 50 MG tablet Take 50 mg by mouth at bedtime.    [provider]  atorvastatin (LIPITOR) 20 MG tablet Take 20 mg by mouth daily.    [provider]  azithromycin (ZITHROMAX Z-PAK) 250 MG tablet Take 2 tablets (500 mg) on  Day 1,  followed by 1 tablet (250 mg) once daily on Days 2 through 5. 09/13/16   Johnn Hai, PA-C  DULoxetine (CYMBALTA) 60 MG capsule Take 60 mg by mouth daily.    [provider]  losartan-hydrochlorothiazide (HYZAAR) 100-25 MG tablet Take 1 tablet by mouth daily.    [provider]  methocarbamol (ROBAXIN) 750 MG tablet Take 750 mg by mouth 2 (two) times daily.    [provider]  metoprolol (LOPRESSOR) 50 MG tablet Take 50 mg by mouth 2 (two) times daily.     [provider]  ondansetron (ZOFRAN-ODT) 4 MG disintegrating tablet Take 4 mg by mouth every 8 (eight) hours as needed for nausea or vomiting.    [provider]  oxyCODONE-acetaminophen (PERCOCET) 5-325 MG tablet Take 1 tablet by mouth every 6 (six) hours as needed for severe pain. 09/13/16   Johnn Hai, PA-C    Allergies Codeine; Darvon [propoxyphene]; Gabapentin; Lisinopril; Wellbutrin [bupropion]; Sulfa antibiotics; Tetracyclines & related; and Trazodone  and nefazodone  No family history on file.  Social History Social History  Substance Use Topics  . Smoking status: Current Every Day Smoker    Packs/day: 1.00    Types: Cigarettes  . Smokeless tobacco: Never Used  . Alcohol use No    Review of Systems Constitutional: No fever/chills Eyes: No visual changes. ENT: No trauma Cardiovascular: Denies chest pain. Positive rib pain. Respiratory: Denies shortness of breath. Positive rib pain. Gastrointestinal: No abdominal pain.   No nausea, no vomiting. Musculoskeletal: Negative for back pain. Skin: Positive for bruising. Neurological: Negative for headaches, focal weakness or numbness.   ____________________________________________   PHYSICAL EXAM:  VITAL SIGNS: ED Triage Vitals  Enc Vitals Group     BP 09/13/16 0850 (!) 154/71     Pulse Rate 09/13/16 0850 87     Resp 09/13/16 0850 20     Temp 09/13/16 0850 97.9 F (36.6 C)     Temp Source 09/13/16 0850 Oral     SpO2 09/13/16 0850 95 %     Weight --      Height --      Head Circumference --      Peak Flow --      Pain Score 09/13/16 0847 10     Pain Loc --      Pain Edu? --      Excl. in [place]? --    Constitutional: Alert and oriented. Well appearing and in no acute distress. Eyes: Conjunctivae are normal. PERRL. EOMI. Head: Atraumatic. Nose: No trauma Neck: No stridor.   Cardiovascular: Normal rate, regular rhythm. Grossly normal heart sounds.  Good peripheral circulation. Respiratory:  Moderate tenderness is noted on palpation of the left lateral ribs without gross deformity or soft tissue swelling. There is some resolving ecchymosis. No crepitus is noted on palpation. No retractions. Lungs with mild expiratory rales. Occasional congested cough secondary to cigarette smoking. Gastrointestinal: Soft and nontender. No distention.  Musculoskeletal: Moves upper and lower extremities without difficulty. Normal gait was noted. Neurologic:  Normal speech and language. No gross focal neurologic deficits are appreciated.  Skin:  Skin is warm, dry and intact. Ecchymosis as noted above. Psychiatric: Mood and affect are normal. Speech and behavior are normal.  ____________________________________________   LABS (all labs ordered are listed, but only abnormal results are displayed)  Labs Reviewed - No data to display  RADIOLOGY  Dg Ribs Unilateral W/chest Left  Result Date: 09/13/2016 CLINICAL DATA:  Left rib pain after fall 3 days ago. EXAM: LEFT  RIBS AND CHEST - 3+ VIEW COMPARISON:  Radiographs of November 23, 2015. FINDINGS: Mildly displaced fractures are seen involving the lateral portions of the left seventh, eighth and ninth ribs. No pneumothorax is noted. Cardiomediastinal silhouette appears normal. Right lung is clear. Mild left basilar atelectasis or infiltrate is noted with a small associated left pleural effusion. IMPRESSION: Mildly displaced left seventh, eighth and ninth rib fractures. Left basilar atelectasis or infiltrate is noted with small associated pleural effusion. Electronically Signed   By: Marijo Conception, M.D.   On: 09/13/2016 09:51    ____________________________________________   PROCEDURES  Procedure(s) performed: None  Procedures  Critical Care performed: No  ____________________________________________   INITIAL IMPRESSION / ASSESSMENT AND PLAN / ED COURSE  Pertinent labs & imaging results that were available during my care of the patient were reviewed by me and considered in my medical decision making (see chart for details).  Patient was strongly encouraged to follow-up with her PCP. Chest  x-ray showed atelectasis versus infiltrate. Patient also had mildly displaced left seventh, eighth, and ninth rib fractures. Patient was encouraged to discontinue or at least decrease the amount of smoking that she is doing. She is given a prescription for Percocet 1 every 6 hours as needed for pain and started on Zithromax as she has taken this in the past without any adverse reactions. Patient was given 1 Percocet in the department as she is not driving.      ____________________________________________   FINAL CLINICAL IMPRESSION(S) / ED DIAGNOSES  Final diagnoses:  Fracture of ribs, three, closed, left, initial encounter  Cigarette smoker      NEW MEDICATIONS STARTED DURING THIS VISIT:  Discharge Medication List as of 09/13/2016 10:39 AM       Note:  This document was prepared using Dragon voice  recognition software and may include unintentional dictation errors.    Johnn Hai, PA-C 09/13/16 1304    Schaevitz, Randall An, MD 09/13/16 [phone]

## 2016-09-13 NOTE — Discharge Instructions (Signed)
Call to make an appointment with your primary care provider for reevaluation. Decrease your smoking. Try to take deep breaths every hour. You may use a pillow to hold against your  ribs when taking the breast but did not tape your ribs. Percocet as needed for pain only as directed. This medication could cause drowsiness increase your risk for falling. Also began taking Z-Pak today.

## 2016-09-13 NOTE — ED Notes (Signed)
See triage note  States she fell last Sunday and then again on weds  Having pain to left rib area

## 2016-09-13 NOTE — ED Triage Notes (Signed)
Pt states she fell  A week ago Sunday and last Wednesday. Pt c/o left lateral rib pain since.Marland Kitchen

## 2016-09-29 ENCOUNTER — Other Ambulatory Visit: Payer: Self-pay | Admitting: Neurology

## 2016-09-29 DIAGNOSIS — G35 Multiple sclerosis: Secondary | ICD-10-CM

## 2016-10-04 ENCOUNTER — Ambulatory Visit: Admission: RE | Admit: 2016-10-04 | Payer: Medicaid Other | Source: Ambulatory Visit

## 2016-10-04 ENCOUNTER — Ambulatory Visit: Payer: Medicaid Other

## 2016-10-05 ENCOUNTER — Ambulatory Visit: Payer: Medicaid Other | Attending: Neurology

## 2016-10-05 VITALS — BP 137/69 | HR 77

## 2016-10-05 DIAGNOSIS — R42 Dizziness and giddiness: Secondary | ICD-10-CM | POA: Diagnosis present

## 2016-10-05 DIAGNOSIS — R2681 Unsteadiness on feet: Secondary | ICD-10-CM | POA: Insufficient documentation

## 2016-10-05 NOTE — Therapy (Signed)
[place] MAIN [facility] SERVICES [address] [place], [place], [address] Phone: [phone]   Fax:  [phone]  Physical Therapy Evaluation  Patient Details  Name: YUMALAY CIRCLE MRN: [id number] Date of Birth: 1955-12-21 Referring Provider: Dr. Manuella Ghazi  Encounter Date: 10/05/2016      PT End of Session - 10/05/16 1510    Visit Number 1   Number of Visits 1   Date for PT Re-Evaluation 10/12/16   Authorization Type no g codes   PT Start Time 1300   PT Stop Time 1420   PT Time Calculation (min) 80 min   Equipment Utilized During Treatment Gait belt   Activity Tolerance Patient tolerated treatment well   Behavior During Therapy [facility] for tasks assessed/performed      Past Medical History:  Diagnosis Date  . Arthritis   . Choledocholithiasis   . COPD (chronic obstructive pulmonary disease) ([place])   . Depression   . Fibromyalgia   . GERD (gastroesophageal reflux disease)   . Headache   . Hx of elevated lipids   . Hypertension   . Pre-diabetes     Past Surgical History:  Procedure Laterality Date  . CESAREAN SECTION    . CHOLECYSTECTOMY    . ERCP  12/2012 and 2015  . ERCP N/A 11/27/2014   Procedure: ENDOSCOPIC RETROGRADE CHOLANGIOPANCREATOGRAPHY (ERCP);  Surgeon: Hulen Luster, MD;  Location: [facility] ENDOSCOPY;  Service: Gastroenterology;  Laterality: N/A;  . ESOPHAGOGASTRODUODENOSCOPY (EGD) WITH PROPOFOL N/A 06/21/2016   Procedure: ESOPHAGOGASTRODUODENOSCOPY (EGD) WITH PROPOFOL;  Surgeon: Lollie Sails, MD;  Location: [facility] ENDOSCOPY;  Service: Endoscopy;  Laterality: N/A;    Vitals:   10/05/16 1304  BP: 137/69  Pulse: 77  SpO2: 97%         Subjective Assessment - 10/05/16 1448    Subjective Dizziness and imbalance   Patient is accompained by: Family member  Spouse   Pertinent History Pt was referred for dizziness and imbalance from neurology. She reports frequent falls over the last couple years, worsened over the last 8  months. Confirms at least 8 falls in the last 8 months with injuries. Pt reports head injuries and her last fall resulted in an ED visit with 3 fractured ribs. Pt states that her falls occur from tripping as well as bilateral LE buckling, worse on LLE. Pt reports constant vertigo every day. 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 She was scheduled to have a lumbar puncture yesterday but did not have the appointment in her calendar.    Limitations Walking   Currently in Pain? --  Not questioned as not related to current referral            [facility] PT Assessment - 10/05/16 0001      Assessment   Medical Diagnosis Dizziness/imbalance   Referring Provider Dr. Manuella Ghazi   Onset Date/Surgical Date 02/05/16  Approximate   Hand Dominance Right   Prior Therapy None     Precautions   Precautions Fall     Restrictions   Weight Bearing Restrictions No     Balance Screen   Has the patient fallen in the past 6 months Yes   How many times? 8 times in the last 8 months   Has the patient had a decrease in activity level because of a fear of falling?  Yes     Home Environment   Living  Environment Private residence   [employer]ed forces logistics/support/administrative officer other   Available Help at Discharge Family   Type of [place] to enter   Entrance Stairs-Number of Steps [place] One level   [place] - 2 wheels;Cane - single point;Tub bench     Prior Function   Level of Independence Independent     Cognition   Overall Cognitive Status Within Functional Limits for tasks assessed     Observation/Other Assessments   Other Surveys  Other Surveys   Activities of Balance Confidence Scale (ABC Scale)  13.13%   Dizziness Handicap Inventory [facility])  84/100     Standardized Balance Assessment   Standardized Balance Assessment Berg Balance  Test;Dynamic Gait Index;Five Times Sit to Stand;10 meter walk test   Five times sit to stand comments  18.4s   10 Meter Walk 10.6s=0.94 m/s     Berg Balance Test   Sit to Stand Able to stand without using hands and stabilize independently   Standing Unsupported Able to stand safely 2 minutes   Sitting with Back Unsupported but Feet Supported on Floor or Stool Able to sit safely and securely 2 minutes   Stand to Sit Sits safely with minimal use of hands   Transfers Able to transfer safely, minor use of hands   Standing Unsupported with Eyes Closed Able to stand 10 seconds safely   Standing Ubsupported with Feet Together Able to place feet together independently and stand 1 minute safely   From Standing, Reach Forward with Outstretched Arm Can reach forward >12 cm safely (5")   From Standing Position, Pick up Object from Floor Able to pick up shoe safely and easily   From Standing Position, Turn to Look Behind Over each Shoulder Looks behind from both sides and weight shifts well   Turn 360 Degrees Able to turn 360 degrees safely in 4 seconds or less   Standing Unsupported, Alternately Place Feet on Step/Stool Able to complete 4 steps without aid or supervision   Standing Unsupported, One Foot in Front Able to plae foot ahead of the other independently and hold 30 seconds   Standing on One Leg Able to lift leg independently and hold equal to or more than 3 seconds   Total Score 50     Dynamic Gait Index   Level Surface Normal   Change in Gait Speed Normal   Gait with Horizontal Head Turns Normal   Gait with Vertical Head Turns Moderate Impairment   Gait and Pivot Turn Normal   Step Over Obstacle Moderate Impairment   Step Around Obstacles Normal   Steps Moderate Impairment   Total Score 18            Objective measurements completed on examination: See above findings.       VESTIBULAR AND BALANCE EVALUATION  HISTORY:  Subjective history of current problem: Pt was  referred for dizziness and imbalance from neurology. She reports frequent falls over the last couple years, worsened over the last 8 months. Confirms at least 8 falls in the last 8 months with injuries. Pt reports head injuries and her last fall resulted in an ED visit with 3 fractured ribs. Pt states that her falls occur from tripping as well as bilateral LE buckling, worse on LLE. Pt reports constant vertigo every day. She underwent a brain MRI on 07/26/16 which showed white matter changes with lesion at the corpus  callosum and was concerning for MS. She was scheduled to have a lumbar puncture yesterday but did not have the appointment in her calendar.  Description of dizziness: (vertigo, unsteadiness, lightheadedness, falling, general unsteadiness, aural fullness) vertigo, lightheadedness, unsteadiness. Frequency: Every day Duration: Constant 24/7 Symptom nature: (motion provoked, positional, spontaneous, constant, variable, intermittent): spontaneous, worsened with movement  Provocative Factors: bending forward, squatting, standing up,  Easing Factors: rest, sleep  Progression of symptoms: (better, worse, no change since onset): worse History of similar episodes: none  Falls (yes/no): yes Number of falls in past 6 months: 8 falls in the last 8 months  Prior Functional Level: Independent with ADLs/IADLs, hasn't been driving recently  Auditory complaints: pt reports auditory hallucinations stating that she intermittently hears voices that aren't present, she reports visual hallucinations while on gabapentin but not since discontinuing, denies hearing loss or tinnitus; Vision (last eye exam, diplopia, recent changes): blurring of vision worse on R side, optometrist reported she had 20/25 vision following recent vision check;  Current Symptoms: Confirms: dysarthria, dysphagia, bilateral LE collapse, weight gain, bilateral LE numbness/tingling worse on the left, reports chronic urinary incontinence  of unknown etiology that has worsened recently.    EXAMINATION  POSTURE: No gross deficits noted  NEUROLOGICAL SCREEN: (2+ unless otherwise noted.) N=normal  Ab=abnormal  Level Dermatome R L Myotome R L Reflex R L  C3 Anterior Neck N A Sidebend C2-3 N N Jaw CN V    C4 Top of Shoulder N A Shoulder Shrug C4 N N Hoffman's UMN A A  C5 Lateral Upper Arm N A Shoulder ABD C4-5 N N Biceps C5-6 2+ 2+  C6 Lateral Arm/ Thumb N A Arm Flex/ Wrist Ext C5-6 N N Brachiorad. C5-6 2+ 2+  C7 Middle Finger N A Arm Ext//Wrist Flex C6-7 N N Triceps C7    C8 4th & 5th Finger N A Flex/ Ext Carpi Ulnaris C8 N N Patella    T1 Medial Arm N A Interossei T1 N N Gastrocnemius    L2 Medial thigh/groin N N Illiopsoas (L2-3) N N     L3 Lower thigh/med.knee N N Quadriceps (L3-4) N N Patellar (L3-4) 3+ 3+  L4 Medial leg/lat thigh N N Tibialis Ant (L4-5) N N     L5 Lat. leg & dorsal foot N N EHL (L5) N N     S1 post/lat foot/thigh/leg N A Gastrocnemius (S1-2) N N Gastrocnemius (S1) 0 0  S2 Post./med. thigh & leg N A Hamstrings (L4-S3) N N Babinski absent absent   No spasticity or clonus bilateral UE/LE  SOMATOSENSORY:         Sensation           Intact      Diminished         Absent  Light touch  Grossly diminished in LUE, S1-S2 dermatomes LLE and L side of face         COORDINATION: Finger to Nose: Dysmetric LUE, normal RUE Pronator Drift: Negative Rapid alternating movements: negative Finger opposition: Negative  MUSCULOSKELETAL SCREEN: Cervical Spine ROM: Grossly WNL but painful in multiple directions   ROM: WNL  MMT: No focal deficits in strength identified. Grossly 4 to 4+/5 throughout bilateral UE/LE  Functional Mobility: Able to perform sit to stand transfers without UE support  Gait: Scanning of visual environment with gait is: fair  Balance: see balance testing  POSTURAL CONTROL TESTS:   Clinical Test of Sensory Interaction for Balance    (CTSIB):  CONDITION TIME STRATEGY  SWAY  Eyes  open, firm surface 30 seconds ankle 1+  Eyes closed, firm surface 8s Knee/hip 4+  Eyes open, foam surface 30 seconds ankle 2+  Eyes closed, foam surface 3s hip 4+    OCULOMOTOR / VESTIBULAR TESTING:  Oculomotor Exam- Room Light  Normal Abnormal Comments  Ocular Alignment N    Ocular ROM N    Spontaneous Nystagmus N    End-Gaze Nystagmus N    Smooth Pursuit  A Saccadic with smooth pursuit bilaterally but worse on the L side  Saccades  A Consistent undershoot to the L with horizontal saccades requiring 2 corrections to reach target, one corrective saccade to the R horizontal, vertical appears normal  VOR  A Worsening dizziness and blurring of target toward end of duration  VOR Cancellation  A Worsening of dizziness and blurring of thumbs  Left Head Thrust N    Right Head Thrust N    Head Shaking Nystagmus N    Static Acuity     Dynamic Acuity       BPPV TESTS: Deferred on this date  FUNCTIONAL OUTCOME MEASURES:  Results Comments  DHI 84 Severe perception of handicap; in need of intervention  ABC Scale 13.13% Falls risk; in need of intervention  DGI 18/24 Falls risk; in need of intervention  BERG 50/56 Falls risk; in need of intervention  10 meter Walking Speed 0.94 m/s Below norms for full community ambulation        Reviewed and performed HEP with patient including Sit to stand x 10, heel/toe rocking, single leg balance, tandem balance. Extensive review of HEP with patient and husband to ensure understanding/compliance.                       PT Education - 10/05/16 1510    Education provided Yes   Education Details HEP and plan of care   Person(s) Educated Patient;Spouse   Methods Explanation;Demonstration;Verbal cues;Handout   Comprehension Verbalized understanding;Returned demonstration             PT Long Term Goals - 10/05/16 1518      PT LONG TERM GOAL #1   Title Pt will be independent with HEP in order to improve strength and balance  in order to decrease fall risk and improve function at home and work.    Time 1   Period Weeks   Status Achieved   Target Date 10/12/16                Plan - 10/05/16 1519    Clinical Impression Statement Pt is a pleasant 61 yo female referred for dizziness/imbalance. Pt has suffered repeated falls with injury over the last 8 months. She presents today with very low balance confidence on ABC of 13.13%. DHI indicates severe perception of handicap with patient scoring 84/100. She ambulates with short shuffling steps with decreased toe to floor clearance but no true foot drop or slap. Pt reports frequently catching toes and falling. No focal weakness identified with strength testing but functional weakness noted during ambulation and with 5TSTS test of 18.4 seconds. Dysmetric finger to nose on the L side and pt reports numbness with light touch sensation testing to entire L side of face, LUE and LLE S1-S2 dermatomes. Balance deficits noted with BERG of 50/56 and DGI of 18/24. Pt also loses balance within 8 seconds with Rhomberg testing and 3 seconds with feet together/eyes closed on foam. 37m gait speed is 0.94 m/s which is  below norms for full community ambulation. Vestibular testing presents with positive VOR but negative VOR head thrust. Smooth pursuit is saccadic, horizontal saccade testing is abnormal, and VOR cancellation is positive. Along with patients history of constant spontaneous vertigo, as well as L side sensory deficits, central etiology is much more heavily weighted as cause for patient's symptoms. Medical record indicates that pt had an appointment for a lumbar puncture yesterday however pt reports that she did not have it marked on her calendar. Pt encouraged to contact neurology to reschedule lumbar puncture. Unfortunately due to insurance coverage only initial PT evaluation is approved. If she does rule in for multiple sclerosis pt encouraged to call back to schedule additional  sessions as she would likely be able to obtain a few more treatment session with a qualifying diagnosis.    History and Personal Factors relevant to plan of care: limited education, increased stress/death of son, frequent falls with injury, urinary incontinence   Clinical Presentation Unstable   Clinical Presentation due to: Fluctuating severity of dizziness and imbalance, frequent falls with injury   Clinical Decision Making High   Rehab Potential Fair   PT Frequency One time visit   PT Duration Other (comment)  1 week   PT Treatment/Interventions Canalith Repostioning;Gait training;Stair training;Functional mobility training;Therapeutic activities;Balance training;Therapeutic exercise;Neuromuscular re-education;Patient/family education;Vestibular   PT Next Visit Plan One-time visit   PT Home Exercise Plan see pt instruction section   Consulted and Agree with Plan of Care Patient;Family member/caregiver   Family Member Consulted Husband      Patient will benefit from skilled therapeutic intervention in order to improve the following deficits and impairments:  Abnormal gait, Decreased balance, Decreased safety awareness, Dizziness, Impaired sensation  Visit Diagnosis: Unsteadiness on feet - Plan: PT plan of care cert/re-cert  Dizziness and giddiness - Plan: PT plan of care cert/re-cert     Problem List There are no active problems to display for this patient.  Phillips Grout PT, DPT   Welma Mccombs 10/05/2016, 3:43 PM  [place] MAIN [facility] SERVICES [address] [place], [place], [address] Phone: [phone]   Fax:  [phone]  Name: LORRAINA SPRING MRN: [id number] Date of Birth: 17-Nov-1955

## 2016-10-13 ENCOUNTER — Ambulatory Visit
Admission: RE | Admit: 2016-10-13 | Discharge: 2016-10-13 | Disposition: A | Discharge status: Home / Self Care | Payer: Medicaid Other | Source: Ambulatory Visit | Attending: Neurology | Admitting: Neurology

## 2016-10-13 DIAGNOSIS — G35 Multiple sclerosis: Secondary | ICD-10-CM

## 2016-10-13 LAB — CSF CELL COUNT WITH DIFFERENTIAL
Eosinophils, CSF: 0 %
LYMPHS CSF: 73 %
Monocyte-Macrophage-Spinal Fluid: 0 %
Other Cells, CSF: 0
RBC COUNT CSF: 1637 /mm3 — AB (ref 0–3)
Segmented Neutrophils-CSF: 27 %
TUBE #: 3
WBC, CSF: 12 /mm3 (ref 0–5)

## 2016-10-13 LAB — GLUCOSE, RANDOM: GLUCOSE: 97 mg/dL (ref 65–99)

## 2016-10-13 LAB — GLUCOSE, CSF: Glucose, CSF: 52 mg/dL (ref 40–70)

## 2016-10-13 LAB — PROTEIN, CSF: Total  Protein, CSF: 40 mg/dL (ref 15–45)

## 2016-10-13 MED ORDER — LIDOCAINE HCL (PF) 1 % IJ SOLN
5.0000 mL | Freq: Once | INTRAMUSCULAR | Status: AC
  Administered 2016-10-13: 5 mL

## 2016-10-13 NOTE — Progress Notes (Signed)
Per Dr. Marcello Moores Register-lab notified Dr. Register of critical lab value on CSF. Dr. Register instructed lab to notify Dr. Joselyn Arrow.

## 2016-10-13 NOTE — Progress Notes (Signed)
Patient ID: Jaime Kidd, female   DOB: Jul 09, 1955, 61 y.o.   MRN: [id number] Pt stable after lp.vss.back stable.f/u with md.d/c instructions given.         Called by lab withcsf results-informed lab to phone Dr Manuella Ghazi the results.

## 2016-10-13 NOTE — Progress Notes (Signed)
Pt seen at bedside per Dr. Rodrigo Ran. Back site CDI. Per MD ok to allow Pt to d/c after 1 full hour post procedure

## 2016-10-14 LAB — IGG CSF INDEX
ALBUMIN CSF-MCNC: 20 mg/dL (ref 11–48)
Albumin: 4.2 g/dL (ref 3.6–4.8)
CSF IgG Index: 0.6 (ref 0.0–0.7)
IGG CSF: 2.4 mg/dL (ref 0.0–8.6)
IgG (Immunoglobin G), Serum: 810 mg/dL (ref 700–1600)
IgG/Alb Ratio, CSF: 0.12 (ref 0.00–0.25)

## 2016-10-14 LAB — PATHOLOGIST SMEAR REVIEW

## 2016-10-19 LAB — OLIGOCLONAL BANDS, CSF + SERM

## 2017-01-03 ENCOUNTER — Encounter: Admission: RE | Payer: Self-pay | Source: Ambulatory Visit

## 2017-01-03 ENCOUNTER — Ambulatory Visit: Admission: RE | Admit: 2017-01-03 | Payer: Medicaid Other | Source: Ambulatory Visit | Admitting: Gastroenterology

## 2017-01-03 SURGERY — COLONOSCOPY WITH PROPOFOL
Anesthesia: General

## 2017-02-11 ENCOUNTER — Ambulatory Visit: Payer: Medicaid Other | Attending: Neurology

## 2017-05-18 ENCOUNTER — Other Ambulatory Visit: Payer: Self-pay | Admitting: Otolaryngology

## 2017-05-18 DIAGNOSIS — R42 Dizziness and giddiness: Secondary | ICD-10-CM

## 2017-05-23 ENCOUNTER — Ambulatory Visit: Payer: Medicaid Other

## 2017-06-07 ENCOUNTER — Ambulatory Visit
Admission: RE | Admit: 2017-06-07 | Discharge: 2017-06-07 | Disposition: A | Discharge status: Home / Self Care | Payer: Medicaid Other | Source: Ambulatory Visit | Attending: Otolaryngology | Admitting: Otolaryngology

## 2017-06-07 DIAGNOSIS — R42 Dizziness and giddiness: Secondary | ICD-10-CM | POA: Diagnosis not present

## 2017-06-09 ENCOUNTER — Ambulatory Visit
Admission: RE | Admit: 2017-06-09 | Discharge: 2017-06-09 | Disposition: A | Discharge status: Home / Self Care | Payer: Medicaid Other | Source: Ambulatory Visit | Attending: Gastroenterology | Admitting: Gastroenterology

## 2017-06-09 ENCOUNTER — Other Ambulatory Visit: Payer: Self-pay | Admitting: Gastroenterology

## 2017-06-09 DIAGNOSIS — D259 Leiomyoma of uterus, unspecified: Secondary | ICD-10-CM | POA: Diagnosis not present

## 2017-06-09 DIAGNOSIS — Z9889 Other specified postprocedural states: Secondary | ICD-10-CM | POA: Insufficient documentation

## 2017-06-09 DIAGNOSIS — K625 Hemorrhage of anus and rectum: Secondary | ICD-10-CM

## 2017-06-09 DIAGNOSIS — R1032 Left lower quadrant pain: Secondary | ICD-10-CM | POA: Diagnosis not present

## 2017-06-09 DIAGNOSIS — I700 Atherosclerosis of aorta: Secondary | ICD-10-CM | POA: Insufficient documentation

## 2017-06-09 DIAGNOSIS — K5641 Fecal impaction: Secondary | ICD-10-CM | POA: Diagnosis not present

## 2017-06-09 DIAGNOSIS — Z9049 Acquired absence of other specified parts of digestive tract: Secondary | ICD-10-CM | POA: Insufficient documentation

## 2017-06-09 LAB — POCT I-STAT CREATININE: Creatinine, Ser: 1.4 mg/dL — ABNORMAL HIGH (ref 0.44–1.00)

## 2017-06-09 MED ORDER — IOPAMIDOL (ISOVUE-300) INJECTION 61%
80.0000 mL | Freq: Once | INTRAVENOUS | Status: AC | PRN
  Administered 2017-06-09: 80 mL via INTRAVENOUS

## 2017-06-14 ENCOUNTER — Ambulatory Visit: Payer: Medicaid Other | Admitting: Physical Therapy

## 2017-06-15 ENCOUNTER — Ambulatory Visit
Admission: RE | Admit: 2017-06-15 | Discharge: 2017-06-15 | Disposition: A | Discharge status: Home / Self Care | Payer: Medicaid Other | Source: Ambulatory Visit | Attending: Internal Medicine | Admitting: Internal Medicine

## 2017-06-15 ENCOUNTER — Ambulatory Visit: Payer: Medicaid Other | Admitting: Anesthesiology

## 2017-06-15 ENCOUNTER — Encounter: Payer: Self-pay | Admitting: *Deleted

## 2017-06-15 ENCOUNTER — Encounter
Admission: RE | Disposition: A | Discharge status: Home / Self Care | Payer: Self-pay | Source: Ambulatory Visit | Attending: Internal Medicine

## 2017-06-15 DIAGNOSIS — Z79899 Other long term (current) drug therapy: Secondary | ICD-10-CM | POA: Diagnosis not present

## 2017-06-15 DIAGNOSIS — R1310 Dysphagia, unspecified: Secondary | ICD-10-CM | POA: Diagnosis not present

## 2017-06-15 DIAGNOSIS — F17200 Nicotine dependence, unspecified, uncomplicated: Secondary | ICD-10-CM | POA: Insufficient documentation

## 2017-06-15 DIAGNOSIS — K2950 Unspecified chronic gastritis without bleeding: Secondary | ICD-10-CM | POA: Insufficient documentation

## 2017-06-15 DIAGNOSIS — K3189 Other diseases of stomach and duodenum: Secondary | ICD-10-CM | POA: Diagnosis not present

## 2017-06-15 DIAGNOSIS — K219 Gastro-esophageal reflux disease without esophagitis: Secondary | ICD-10-CM | POA: Insufficient documentation

## 2017-06-15 DIAGNOSIS — K921 Melena: Secondary | ICD-10-CM | POA: Diagnosis present

## 2017-06-15 DIAGNOSIS — R7303 Prediabetes: Secondary | ICD-10-CM | POA: Insufficient documentation

## 2017-06-15 DIAGNOSIS — K5730 Diverticulosis of large intestine without perforation or abscess without bleeding: Secondary | ICD-10-CM | POA: Diagnosis not present

## 2017-06-15 DIAGNOSIS — J449 Chronic obstructive pulmonary disease, unspecified: Secondary | ICD-10-CM | POA: Insufficient documentation

## 2017-06-15 DIAGNOSIS — I10 Essential (primary) hypertension: Secondary | ICD-10-CM | POA: Diagnosis not present

## 2017-06-15 DIAGNOSIS — M797 Fibromyalgia: Secondary | ICD-10-CM | POA: Insufficient documentation

## 2017-06-15 DIAGNOSIS — F329 Major depressive disorder, single episode, unspecified: Secondary | ICD-10-CM | POA: Diagnosis not present

## 2017-06-15 HISTORY — DX: Gastritis, unspecified, without bleeding: K29.70

## 2017-06-15 HISTORY — DX: Duodenitis without bleeding: K29.80

## 2017-06-15 HISTORY — DX: Personal history of other diseases of the digestive system: Z87.19

## 2017-06-15 HISTORY — PX: ESOPHAGOGASTRODUODENOSCOPY (EGD) WITH PROPOFOL: SHX5813

## 2017-06-15 HISTORY — PX: COLONOSCOPY WITH PROPOFOL: SHX5780

## 2017-06-15 SURGERY — COLONOSCOPY WITH PROPOFOL
Anesthesia: General

## 2017-06-15 MED ORDER — PROPOFOL 10 MG/ML IV BOLUS
INTRAVENOUS | Status: DC | PRN
  Administered 2017-06-15 (×2): 20 mg via INTRAVENOUS
  Administered 2017-06-15: 80 mg via INTRAVENOUS

## 2017-06-15 MED ORDER — LIDOCAINE HCL (CARDIAC) PF 100 MG/5ML IV SOSY
PREFILLED_SYRINGE | INTRAVENOUS | Status: DC | PRN
  Administered 2017-06-15: 60 mg via INTRAVENOUS

## 2017-06-15 MED ORDER — PROPOFOL 500 MG/50ML IV EMUL
INTRAVENOUS | Status: AC
  Filled 2017-06-15: qty 100

## 2017-06-15 MED ORDER — PROPOFOL 10 MG/ML IV BOLUS
INTRAVENOUS | Status: AC
  Filled 2017-06-15: qty 20

## 2017-06-15 MED ORDER — PROPOFOL 500 MG/50ML IV EMUL
INTRAVENOUS | Status: DC | PRN
  Administered 2017-06-15: 160 ug/kg/min via INTRAVENOUS

## 2017-06-15 MED ORDER — SODIUM CHLORIDE 0.9 % IV SOLN
INTRAVENOUS | Status: DC
  Administered 2017-06-15: 1000 mL via INTRAVENOUS

## 2017-06-15 NOTE — Interval H&P Note (Signed)
History and Physical Interval Note:  06/15/2017 2:26 PM  Jaime Kidd  has presented today for surgery, with the diagnosis of LLQ PAIN RECTAL BLEED  The various methods of treatment have been discussed with the patient and family. After consideration of risks, benefits and other options for treatment, the patient has consented to  Procedure(s): COLONOSCOPY WITH PROPOFOL (N/A) ESOPHAGOGASTRODUODENOSCOPY (EGD) WITH PROPOFOL (N/A) as a surgical intervention .  The patient's history has been reviewed, patient examined, no change in status, stable for surgery.  I have reviewed the patient's chart and labs.  Questions were answered to the patient's satisfaction.     [place], [place]

## 2017-06-15 NOTE — Anesthesia Post-op Follow-up Note (Signed)
Anesthesia QCDR form completed.        

## 2017-06-15 NOTE — Op Note (Signed)
[facility] Patient Name: Jaime Kidd Procedure Date: 06/15/2017 3:10 PM MRN: [id number] Account #: [id number] Date of Birth: 1955/11/11 Admit Type: Outpatient Age: 62 Room: [facility] ENDO ROOM 3 Gender: Female Note Status: Finalized Procedure:            Upper GI endoscopy Indications:          Abdominal pain in the left lower quadrant, Dysphagia Providers:            Benay Pike. Alice Reichert MD, MD Referring MD:         No Local Md, MD (Referring MD) Medicines:            Propofol per Anesthesia Complications:        No immediate complications. Procedure:            Pre-Anesthesia Assessment:                       - The risks and benefits of the procedure and the                        sedation options and risks were discussed with the                        patient. All questions were answered and informed                        consent was obtained.                       - Patient identification and proposed procedure were                        verified prior to the procedure by the nurse. The                        procedure was verified in the pre-procedure area in the                        procedure room.                       - ASA Grade Assessment: III - A patient with severe                        systemic disease.                       - After reviewing the risks and benefits, the patient                        was deemed in satisfactory condition to undergo the                        procedure.                       After obtaining informed consent, the endoscope was                        passed under direct vision. Throughout the procedure,  the patient's blood pressure, pulse, and oxygen                        saturations were monitored continuously. The Endoscope                        was introduced through the mouth, and advanced to the                        third part of duodenum. The upper GI endoscopy was                         accomplished without difficulty. The patient tolerated                        the procedure well. Findings:      No endoscopic abnormality was evident in the esophagus to explain the       patient's complaint of dysphagia. It was decided, however, to proceed       with dilation in the distal esophagus. The scope was withdrawn. Dilation       was performed with a Maloney dilator with no resistance at 67 Fr.      Localized mildly erythematous mucosa without bleeding was found on the       anterior wall of the gastric antrum. Biopsies were taken with a cold       forceps for Helicobacter pylori testing.      The cardia and gastric fundus were normal on retroflexion.      The examined duodenum was normal. Impression:           - No endoscopic esophageal abnormality to explain                        patient's dysphagia. Esophagus dilated. Dilated.                       - Erythematous mucosa in the anterior wall of the                        gastric antrum. Biopsied.                       - Normal examined duodenum. Recommendation:       - Await pathology results.                       - Proceed with colonoscopy Procedure Code(s):    --- Professional ---                       [phone], Esophagogastroduodenoscopy, flexible, transoral;                        with biopsy, single or multiple                       43450, Dilation of esophagus, by unguided sound or                        bougie, single or multiple passes Diagnosis Code(s):    --- Professional ---  R13.10, Dysphagia, unspecified                       K31.89, Other diseases of stomach and duodenum                       R10.32, Left lower quadrant pain CPT copyright 2017 American Medical Association. All rights reserved. The codes documented in this report are preliminary and upon coder review may  be revised to meet current compliance requirements. Efrain Sella MD, MD 06/15/2017 3:50:44  PM This report has been signed electronically. Number of Addenda: 0 Note Initiated On: 06/15/2017 3:10 PM Scope Withdrawal Time: 0 hours 5 minutes 42 seconds  Total Procedure Duration: 0 hours 10 minutes 25 seconds       [facility]

## 2017-06-15 NOTE — Op Note (Signed)
[facility] Patient Name: Jaime Kidd Procedure Date: 06/15/2017 3:09 PM MRN: [id number] Account #: [id number] Date of Birth: 1955/06/16 Admit Type: Outpatient Age: 62 Room: [facility] ENDO ROOM 3 Gender: Female Note Status: Finalized Procedure:            Colonoscopy Indications:          Hematochezia Providers:            Benay Pike. Alice Reichert MD, MD Referring MD:         No Local Md, MD (Referring MD) Medicines:            Propofol per Anesthesia Complications:        No immediate complications. Procedure:            Pre-Anesthesia Assessment:                       - The risks and benefits of the procedure and the                        sedation options and risks were discussed with the                        patient. All questions were answered and informed                        consent was obtained.                       - Patient identification and proposed procedure were                        verified prior to the procedure by the nurse. The                        procedure was verified in the procedure room.                       - ASA Grade Assessment: III - A patient with severe                        systemic disease.                       - After reviewing the risks and benefits, the patient                        was deemed in satisfactory condition to undergo the                        procedure.                       After obtaining informed consent, the colonoscope was                        passed under direct vision. Throughout the procedure,                        the patient's blood pressure, pulse, and oxygen  saturations were monitored continuously. The                        Colonoscope was introduced through the anus and                        advanced to the the cecum, identified by appendiceal                        orifice and ileocecal valve. The colonoscopy was                        performed without  difficulty. The patient tolerated the                        procedure well. The quality of the bowel preparation                        was good. The ileocecal valve, appendiceal orifice, and                        rectum were photographed. Findings:      The perianal and digital rectal examinations were normal. Pertinent       negatives include normal sphincter tone and no palpable rectal lesions.      Many small-mouthed diverticula were found in the sigmoid colon.      Normal mucosa was found in the entire colon.      The exam was otherwise without abnormality. Impression:           - Diverticulosis in the sigmoid colon.                       - Normal mucosa in the entire examined colon.                       - The examination was otherwise normal.                       - No specimens collected. Recommendation:       - Patient has a contact number available for                        emergencies. The signs and symptoms of potential                        delayed complications were discussed with the patient.                        Return to normal activities tomorrow. Written discharge                        instructions were provided to the patient.                       - Resume previous diet.                       - Continue present medications.                       - Repeat colonoscopy in 10 years for  screening purposes.                       - Return to physician assistant in 3 months.                       - The findings and recommendations were discussed with                        the patient and their family. Procedure Code(s):    --- Professional ---                       [phone], Colonoscopy, flexible; diagnostic, including                        collection of specimen(s) by brushing or washing, when                        performed (separate procedure) Diagnosis Code(s):    --- Professional ---                       K92.1, Melena (includes Hematochezia)                        K57.30, Diverticulosis of large intestine without                        perforation or abscess without bleeding CPT copyright 2017 American Medical Association. All rights reserved. The codes documented in this report are preliminary and upon coder review may  be revised to meet current compliance requirements. Efrain Sella MD, MD 06/15/2017 3:54:54 PM This report has been signed electronically. Number of Addenda: 0 Note Initiated On: 06/15/2017 3:09 PM      [facility]

## 2017-06-15 NOTE — Anesthesia Preprocedure Evaluation (Signed)
Anesthesia Evaluation  Patient identified by MRN, date of birth, ID band Patient awake    Reviewed: Allergy & Precautions, H&P , NPO status , Patient's Chart, lab work & pertinent test results, reviewed documented beta blocker date and time   Airway Mallampati: II   Neck ROM: full    Dental  (+) Teeth Intact   Pulmonary neg pulmonary ROS, COPD, Current Smoker,    Pulmonary exam normal        Cardiovascular Exercise Tolerance: Poor hypertension, On Medications negative cardio ROS Normal cardiovascular exam Rhythm:regular Rate:Normal     Neuro/Psych  Headaches, PSYCHIATRIC DISORDERS Depression  Neuromuscular disease negative neurological ROS  negative psych ROS   GI/Hepatic negative GI ROS, Neg liver ROS, hiatal hernia, GERD  Medicated,  Endo/Other  negative endocrine ROS  Renal/GU negative Renal ROS  negative genitourinary   Musculoskeletal   Abdominal   Peds  Hematology negative hematology ROS (+)   Anesthesia Other Findings Past Medical History: No date: Arthritis No date: Choledocholithiasis No date: COPD (chronic obstructive pulmonary disease) (HCC) No date: Depression 06/21/2016: Duodenitis No date: Fibromyalgia No date: Gastritis No date: GERD (gastroesophageal reflux disease) No date: Headache     Comment:  migraines No date: History of hiatal hernia No date: Hx of elevated lipids No date: Hypertension No date: Pre-diabetes Past Surgical History: No date: CESAREAN SECTION No date: CHOLECYSTECTOMY 12/2012 and 2015: ERCP 11/27/2014: ERCP; N/A     Comment:  Procedure: ENDOSCOPIC RETROGRADE               CHOLANGIOPANCREATOGRAPHY (ERCP);  Surgeon: Hulen Luster, MD;              Location: [facility] ENDOSCOPY;  Service: Gastroenterology;                Laterality: N/A; 06/21/2016: ESOPHAGOGASTRODUODENOSCOPY (EGD) WITH PROPOFOL; N/A     Comment:  Procedure: ESOPHAGOGASTRODUODENOSCOPY (EGD) WITH   PROPOFOL;  Surgeon: Lollie Sails, MD;  Location:               [facility] ENDOSCOPY;  Service: Endoscopy;  Laterality: N/A;   Reproductive/Obstetrics negative OB ROS                             Anesthesia Physical Anesthesia Plan  ASA: III  Anesthesia Plan: General   Post-op Pain Management:    Induction:   PONV Risk Score and Plan:   Airway Management Planned:   Additional Equipment:   Intra-op Plan:   Post-operative Plan:   Informed Consent: I have reviewed the patients History and Physical, chart, labs and discussed the procedure including the risks, benefits and alternatives for the proposed anesthesia with the patient or authorized representative who has indicated his/her understanding and acceptance.   Dental Advisory Given  Plan Discussed with: CRNA  Anesthesia Plan Comments:         Anesthesia Quick Evaluation

## 2017-06-15 NOTE — H&P (Signed)
Outpatient short stay form Pre-procedure 06/15/2017 2:24 PM Teodoro K. Alice Reichert, M.D.  Primary Physician: Corena Herter  Reason for visit:  Rectal bleeding, GERD, LLQ pain.  History of present illness:  As above. Patient says diarrhea has resolved but intermittent red blood per rectum persists periodically. Denies weight loss, dysphagia. Has LLQ pain not strictly related to bowel movements. Last colonoscopy > 15 years ago.    No current facility-administered medications for this encounter.   Medications Prior to Admission  Medication Sig Dispense Refill Last Dose  . albuterol (PROVENTIL HFA;VENTOLIN HFA) 108 (90 BASE) MCG/ACT inhaler Inhale into the lungs every 6 (six) hours as needed for wheezing or shortness of breath.   Past Week at Unknown time  . amitriptyline (ELAVIL) 50 MG tablet Take 50 mg by mouth at bedtime.   06/14/2017 at Unknown time  . atorvastatin (LIPITOR) 20 MG tablet Take 20 mg by mouth daily.   06/14/2017 at Unknown time  . esomeprazole (NEXIUM) 40 MG capsule Take 40 mg by mouth daily at 12 noon.   06/14/2017 at Unknown time  . losartan-hydrochlorothiazide (HYZAAR) 100-25 MG tablet Take 1 tablet by mouth daily.   06/14/2017 at Unknown time  . metoprolol (LOPRESSOR) 50 MG tablet Take 50 mg by mouth 2 (two) times daily.    06/14/2017 at Unknown time  . ondansetron (ZOFRAN-ODT) 4 MG disintegrating tablet Take 4 mg by mouth every 8 (eight) hours as needed for nausea or vomiting.   Past Month at Unknown time  . sucralfate (CARAFATE) 1 g tablet Take 1 g by mouth 4 (four) times daily -  with meals and at bedtime.   06/14/2017 at Unknown time  . azithromycin (ZITHROMAX Z-PAK) 250 MG tablet Take 2 tablets (500 mg) on  Day 1,  followed by 1 tablet (250 mg) once daily on Days 2 through 5. (Patient not taking: Reported on 06/15/2017) 6 each 0 Completed Course at Unknown time  . DULoxetine (CYMBALTA) 60 MG capsule Take 60 mg by mouth daily.   Completed Course at Unknown time  . methocarbamol  (ROBAXIN) 750 MG tablet Take 750 mg by mouth 2 (two) times daily.   Completed Course at Unknown time  . oxyCODONE-acetaminophen (PERCOCET) 5-325 MG tablet Take 1 tablet by mouth every 6 (six) hours as needed for severe pain. (Patient not taking: Reported on 06/15/2017) 20 tablet 0 Completed Course at Unknown time     Allergies  Allergen Reactions  . Codeine Nausea And Vomiting  . Darvon [Propoxyphene] Nausea And Vomiting  . Gabapentin Other (See Comments)    Hallucination   . Lisinopril   . Wellbutrin [Bupropion] Other (See Comments)    "Makes me mean"  . Sulfa Antibiotics Rash  . Tetracyclines & Related Rash  . Trazodone And Nefazodone Rash     Past Medical History:  Diagnosis Date  . Arthritis   . Choledocholithiasis   . COPD (chronic obstructive pulmonary disease) ([place])   . Depression   . Duodenitis 06/21/2016  . Fibromyalgia   . Gastritis   . GERD (gastroesophageal reflux disease)   . Headache    migraines  . History of hiatal hernia   . Hx of elevated lipids   . Hypertension   . Pre-diabetes     Review of systems:   Otherwise negative.    Physical Exam  Gen: Alert, oriented. Appears stated age.  HEENT: [place]/AT. PERRLA. Lungs: CTA, no wheezes. CV: RR nl S1, S2. Abd: soft, benign, no masses. BS+ Ext: No edema. Pulses  2+    Planned procedures: EGD and colonoscopy. The patient understands the nature of the planned procedure, indications, risks, alternatives and potential complications including but not limited to bleeding, infection, perforation, damage to internal organs and possible oversedation/side effects from anesthesia. The patient agrees and gives consent to proceed.  Please refer to procedure notes for findings, recommendations and patient disposition/instructions.    Teodoro K. Alice Reichert, M.D. Gastroenterology 06/15/2017  2:24 PM

## 2017-06-15 NOTE — Transfer of Care (Signed)
Immediate Anesthesia Transfer of Care Note  Patient: Jaime Kidd  Procedure(s) Performed: COLONOSCOPY WITH PROPOFOL (N/A ) ESOPHAGOGASTRODUODENOSCOPY (EGD) WITH PROPOFOL (N/A )  Patient Location: PACU  Anesthesia Type:General  Level of Consciousness: sedated  Airway & Oxygen Therapy: Patient Spontanous Breathing and Patient connected to nasal cannula oxygen  Post-op Assessment: Report given to RN and Post -op Vital signs reviewed and stable  Post vital signs: Reviewed and stable  Last Vitals:  Vitals Value Taken Time  BP 122/57 06/15/2017  3:47 PM  Temp 36.2 C 06/15/2017  3:47 PM  Pulse 87 06/15/2017  3:49 PM  Resp 18 06/15/2017  3:49 PM  SpO2 100 % 06/15/2017  3:49 PM  Vitals shown include unvalidated device data.  Last Pain:  Vitals:   06/15/17 1547  TempSrc: Tympanic  PainSc: Asleep      Patients Stated Pain Goal: 0 (69/67/89 3810)  Complications: No apparent anesthesia complications

## 2017-06-16 ENCOUNTER — Encounter: Payer: Self-pay | Admitting: Internal Medicine

## 2017-06-17 LAB — SURGICAL PATHOLOGY

## 2017-06-17 NOTE — Anesthesia Postprocedure Evaluation (Addendum)
Anesthesia Post Note  Patient: Jaime Kidd  Procedure(s) Performed: COLONOSCOPY WITH PROPOFOL (N/A ) ESOPHAGOGASTRODUODENOSCOPY (EGD) WITH PROPOFOL (N/A )  Patient location during evaluation: PACU Anesthesia Type: General Level of consciousness: awake and alert Pain management: pain level controlled Vital Signs Assessment: post-procedure vital signs reviewed and stable Respiratory status: spontaneous breathing, nonlabored ventilation, respiratory function stable and patient connected to nasal cannula oxygen Cardiovascular status: blood pressure returned to baseline and stable Postop Assessment: no apparent nausea or vomiting Anesthetic complications: no     Last Vitals:  Vitals:   06/15/17 1557 06/15/17 1607  BP: 120/64 130/67  Pulse: 88 82  Resp: 14 17  Temp:    SpO2: 99% 100%    Last Pain:  Vitals:   06/15/17 1607  TempSrc:   PainSc: 0-No pain                 Molli Barrows

## 2017-06-21 ENCOUNTER — Ambulatory Visit: Payer: Medicaid Other | Admitting: Physical Therapy

## 2017-06-28 ENCOUNTER — Ambulatory Visit: Payer: Medicaid Other | Admitting: Physical Therapy

## 2017-07-05 ENCOUNTER — Encounter: Payer: Medicaid Other | Admitting: Physical Therapy

## 2017-07-12 ENCOUNTER — Encounter: Payer: Medicaid Other | Admitting: Physical Therapy

## 2017-07-13 ENCOUNTER — Other Ambulatory Visit: Payer: Self-pay | Admitting: Internal Medicine

## 2017-07-13 DIAGNOSIS — R0602 Shortness of breath: Secondary | ICD-10-CM

## 2017-07-13 DIAGNOSIS — I208 Other forms of angina pectoris: Secondary | ICD-10-CM

## 2017-08-02 ENCOUNTER — Encounter
Admission: RE | Admit: 2017-08-02 | Discharge: 2017-08-02 | Disposition: A | Discharge status: Home / Self Care | Payer: Medicaid Other | Source: Ambulatory Visit | Attending: Internal Medicine | Admitting: Internal Medicine

## 2017-08-02 ENCOUNTER — Ambulatory Visit
Admission: RE | Admit: 2017-08-02 | Discharge: 2017-08-02 | Disposition: A | Discharge status: Home / Self Care | Payer: Medicaid Other | Source: Ambulatory Visit | Attending: Internal Medicine | Admitting: Internal Medicine

## 2017-08-02 DIAGNOSIS — K219 Gastro-esophageal reflux disease without esophagitis: Secondary | ICD-10-CM | POA: Diagnosis not present

## 2017-08-02 DIAGNOSIS — R0602 Shortness of breath: Secondary | ICD-10-CM | POA: Diagnosis present

## 2017-08-02 DIAGNOSIS — I208 Other forms of angina pectoris: Secondary | ICD-10-CM | POA: Insufficient documentation

## 2017-08-02 DIAGNOSIS — J449 Chronic obstructive pulmonary disease, unspecified: Secondary | ICD-10-CM | POA: Diagnosis not present

## 2017-08-02 DIAGNOSIS — R7303 Prediabetes: Secondary | ICD-10-CM | POA: Diagnosis not present

## 2017-08-02 DIAGNOSIS — I10 Essential (primary) hypertension: Secondary | ICD-10-CM | POA: Diagnosis not present

## 2017-08-02 MED ORDER — TECHNETIUM TC 99M TETROFOSMIN IV KIT
14.0500 | PACK | Freq: Once | INTRAVENOUS | Status: AC | PRN
  Administered 2017-08-02: 14.05 via INTRAVENOUS

## 2017-08-02 MED ORDER — TECHNETIUM TC 99M TETROFOSMIN IV KIT
32.4900 | PACK | Freq: Once | INTRAVENOUS | Status: AC | PRN
  Administered 2017-08-02: 32.49 via INTRAVENOUS

## 2017-08-02 MED ORDER — REGADENOSON 0.4 MG/5ML IV SOLN
0.4000 mg | Freq: Once | INTRAVENOUS | Status: AC
Start: 2017-08-02 — End: 2017-08-02
  Administered 2017-08-02: 0.4 mg via INTRAVENOUS

## 2017-08-02 NOTE — Progress Notes (Signed)
*  PRELIMINARY RESULTS* Echocardiogram 2D Echocardiogram has been performed.  Sherrie Sport 08/02/2017, 9:36 AM

## 2017-09-06 LAB — NM MYOCAR MULTI W/SPECT W/WALL MOTION / EF
CHL CUP MPHR: 157 {beats}/min
CHL CUP NUCLEAR SDS: 0
CHL CUP NUCLEAR SRS: 3
CHL CUP NUCLEAR SSS: 2
CSEPPHR: 86 {beats}/min
Estimated workload: 1 METS
Exercise duration (min): 1 min
Exercise duration (sec): 0 s
LVDIAVOL: 30 mL (ref 46–106)
LVSYSVOL: 8 mL
NUC STRESS TID: 0.71
Percent HR: 54 %
Rest HR: 68 {beats}/min

## 2018-07-28 ENCOUNTER — Other Ambulatory Visit: Payer: Self-pay | Admitting: Neurology

## 2018-07-28 DIAGNOSIS — M5416 Radiculopathy, lumbar region: Secondary | ICD-10-CM

## 2018-08-14 ENCOUNTER — Other Ambulatory Visit: Payer: Self-pay

## 2018-08-14 ENCOUNTER — Other Ambulatory Visit: Payer: Self-pay | Admitting: Neurology

## 2018-08-14 ENCOUNTER — Ambulatory Visit
Admission: RE | Admit: 2018-08-14 | Discharge: 2018-08-14 | Disposition: A | Discharge status: Home / Self Care | Payer: Medicaid Other | Source: Ambulatory Visit | Attending: Neurology | Admitting: Neurology

## 2018-08-14 DIAGNOSIS — M5416 Radiculopathy, lumbar region: Secondary | ICD-10-CM | POA: Insufficient documentation

## 2018-08-14 HISTORY — DX: Unspecified asthma, uncomplicated: J45.909

## 2018-08-14 LAB — POCT I-STAT CREATININE: Creatinine, Ser: 0.9 mg/dL (ref 0.44–1.00)

## 2018-08-14 MED ORDER — GADOBUTROL 1 MMOL/ML IV SOLN: 6.0000 mL | Freq: Once | INTRAVENOUS | Status: DC | PRN

## 2018-10-08 IMAGING — CT CT ABD-PELV W/ CM
2 of 5 series · 15 of 46 positions shown, 17 images · IV contrast (iopamidol)
Comparison: 01/07/2014

CLINICAL DATA: Pt c/o rectal bleeding with abdominal bloating x 4
days. She also states she has abdominal pain from under her ribs on
her Left side down to her rectum. She also c/o pelvic cramping. NKI
No hx CA. Hx Cholecystectomy and appendectomy.

EXAM:
CT ABDOMEN AND PELVIS WITH CONTRAST
TECHNIQUE: Multidetector CT imaging of the abdomen and pelvis was performed
using the standard protocol following bolus administration of
intravenous contrast.
CONTRAST:  80mL [id number] IOPAMIDOL ([id number]) INJECTION 61%

[Series 2: abd pelvis · axial · 0.69mm/px · z∈[-1515,-1100]mm · 12 of 95 slices shown, 14 images (1 of 2)]
[im 6/95  soft-tissue]
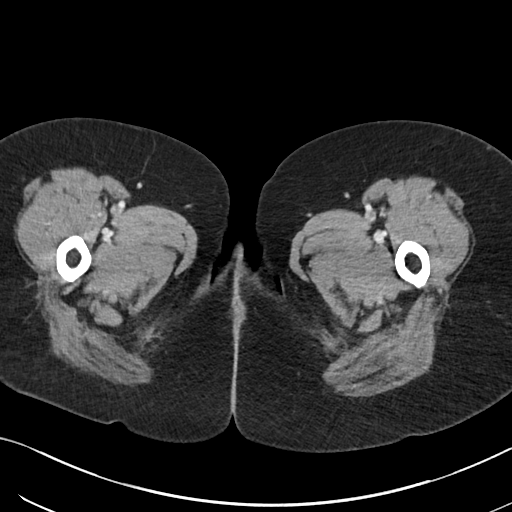
[im 6/95  bone]
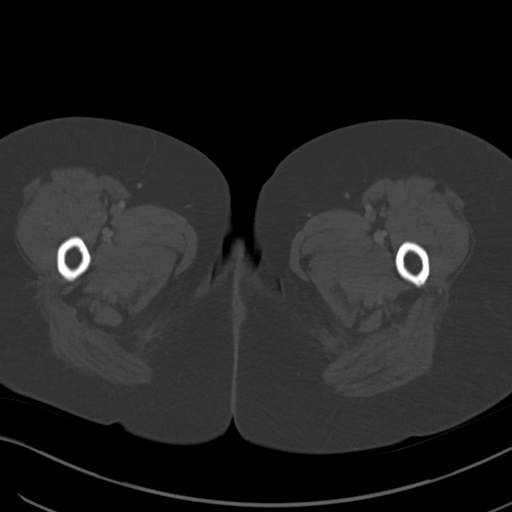
[im 16/95  soft-tissue]
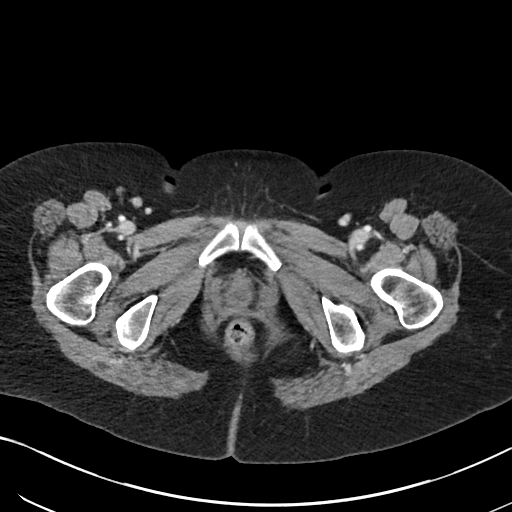
[im 21/95  soft-tissue]
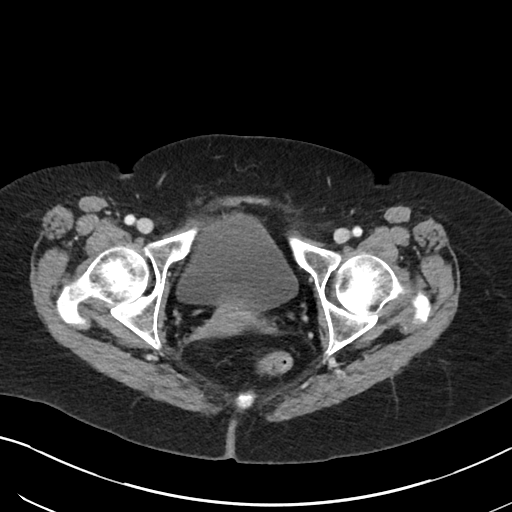
[im 27/95  soft-tissue]
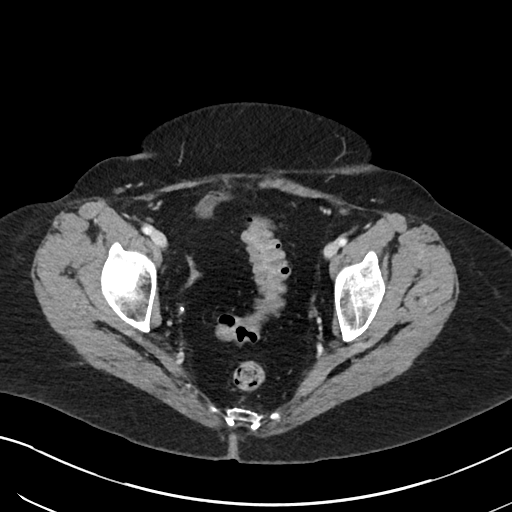
[im 37/95  soft-tissue]
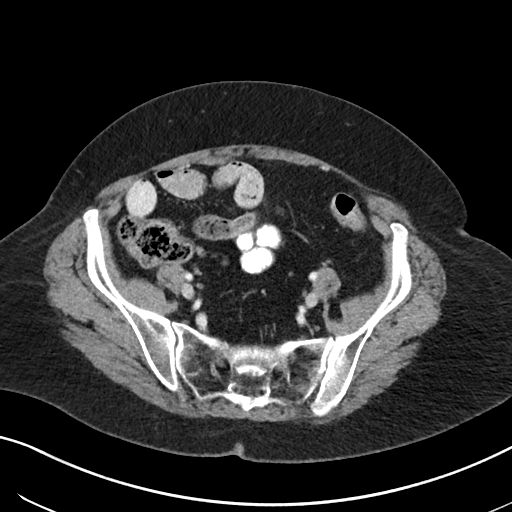
[im 42/95  soft-tissue]
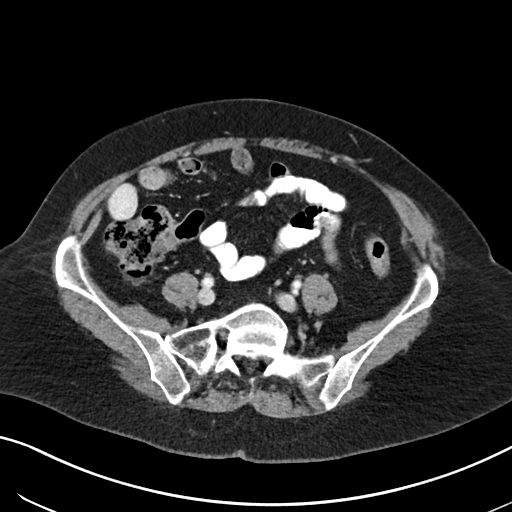
[im 53/95  soft-tissue]
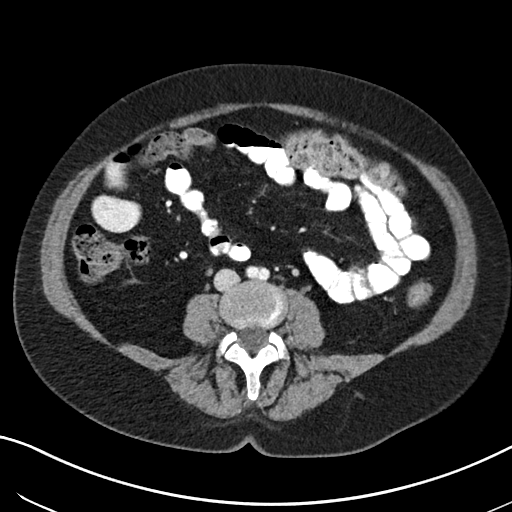
[im 58/95  soft-tissue]
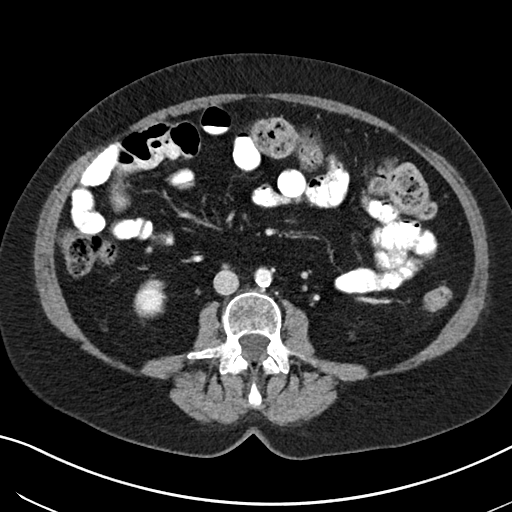
[im 68/95  soft-tissue]
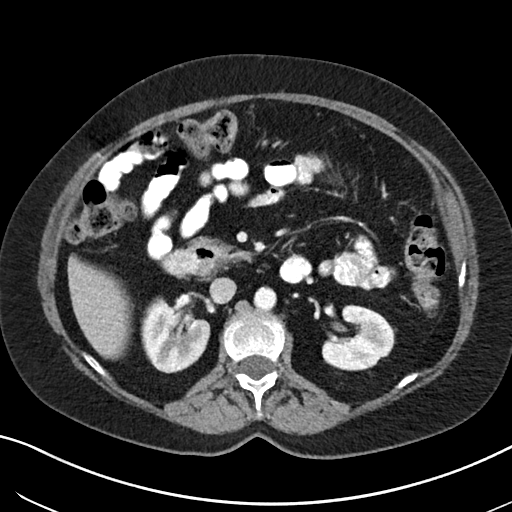
[im 68/95  bone]
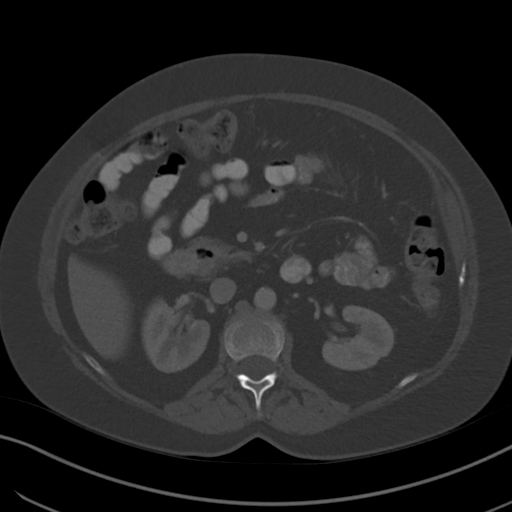
[im 74/95  soft-tissue]
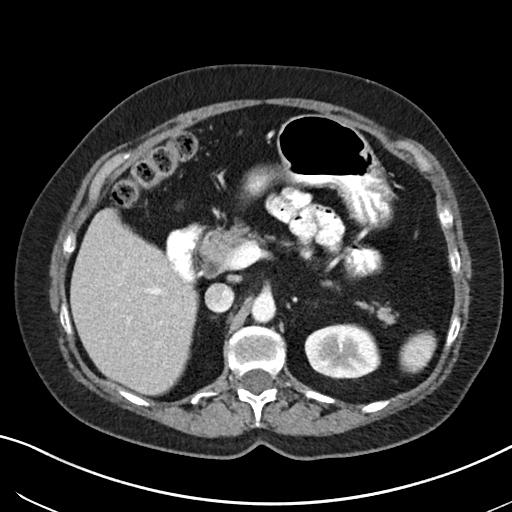
[im 79/95  soft-tissue]
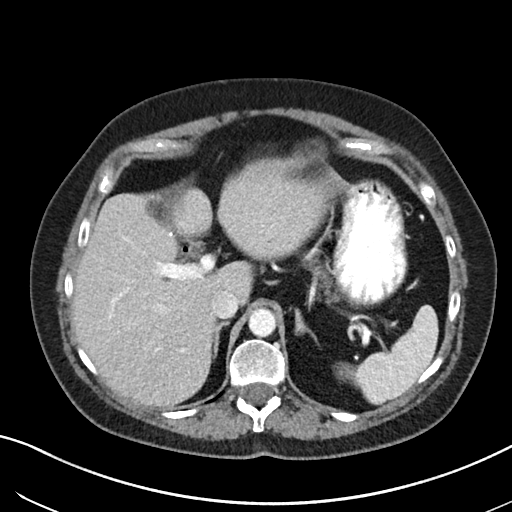
[im 89/95  soft-tissue]
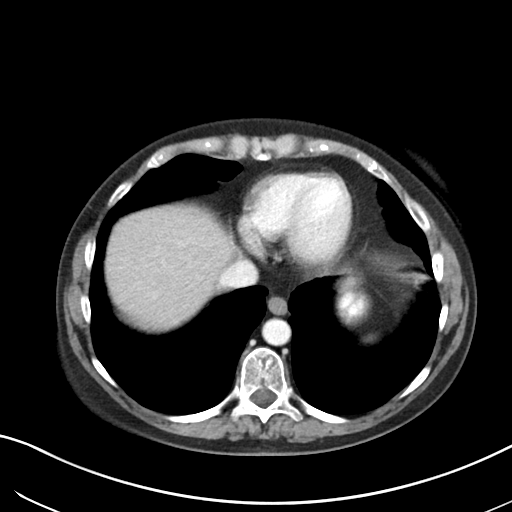

[Series 4: abd pelvis · coronal · 0.70mm/px · 3 of 144 slices shown (2 of 2)]
[im 48/144  soft-tissue]
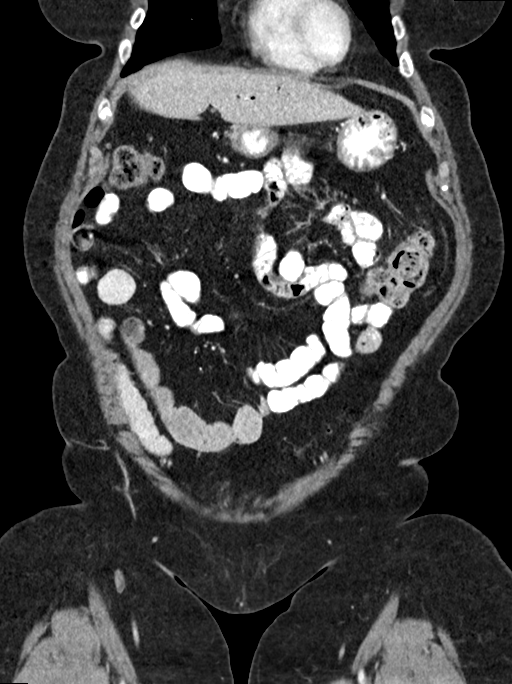
[im 64/144  soft-tissue]
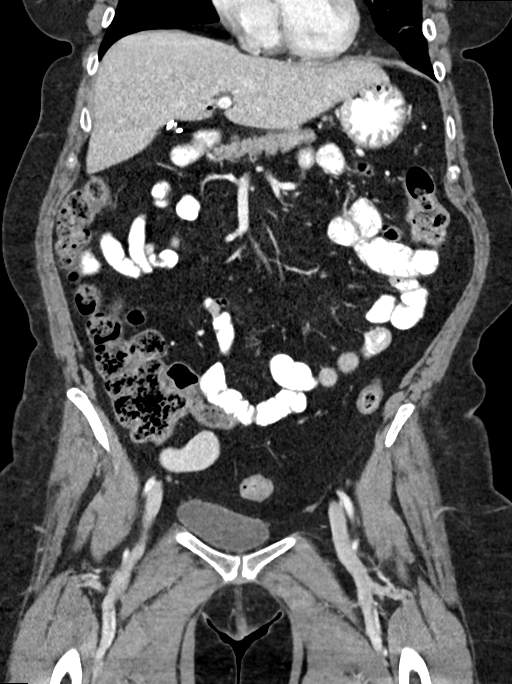
[im 80/144  soft-tissue]
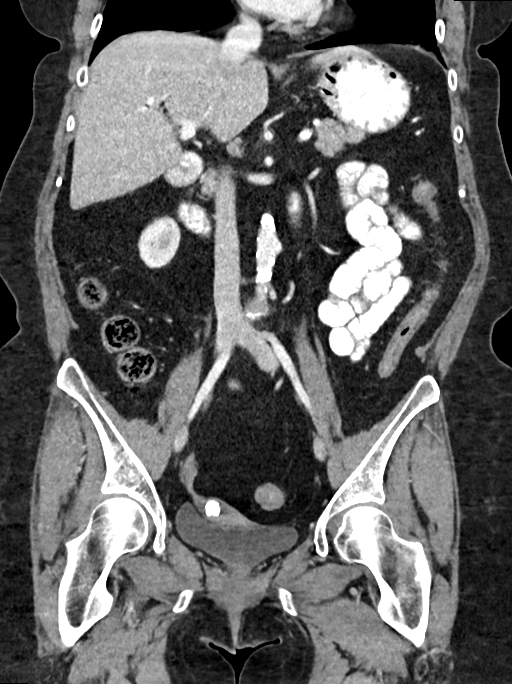

[15 of 46 positions shown; findings below may reference images not displayed]

FINDINGS: Lower chest: No pulmonary nodules, pleural effusions, or
infiltrates. Heart size is normal. No pericardial effusion or
significant coronary artery calcifications.

Hepatobiliary: Status post cholecystectomy. There is mild prominence
of intra hepatic ducts and pneumobilia, consistent with
postoperative state. The liver is homogeneous.

Pancreas: Unremarkable. No pancreatic ductal dilatation or
surrounding inflammatory changes.

Spleen: Normal in size without focal abnormality.

Adrenals/Urinary Tract: Kidneys are normal in appearance. No
hydronephrosis or obstructing stones. Urinary bladder is
unremarkable in appearance.

Stomach/Bowel: The stomach is normal in appearance. Duodenal
diverticulum is noted. No small bowel dilatation. The appendix is
well seen and has a normal appearance.There is significant stool
burden throughout loops of colon. No obstructing mass. No
significant diverticular disease. No contour deforming rectal mass.

Vascular/Lymphatic: There is mild atherosclerotic calcification of
the abdominal aorta not associated with aneurysm. There is normal
vascular opacification of the celiac axis, superior mesenteric
artery, and inferior mesenteric artery. Normal appearance of the
portal venous system and inferior vena cava. No retroperitoneal or
mesenteric adenopathy.

Reproductive: The uterus is present and contains a coarse
calcification consistent fibroid. No adnexal mass.

Other: No free pelvic fluid. Anterior abdominal wall is
unremarkable.

Musculoskeletal: No acute or significant osseous findings.
IMPRESSION: 1.  No evidence for acute  abnormality.
2. Status post cholecystectomy.  Postoperative pneumobilia.
3. Despite the history of appendectomy, there is a normal appearing
appendix.
4. Moderate stool burden.
5.  Aortic atherosclerosis.  ([id number])
6. Uterine fibroid.

## 2018-10-17 ENCOUNTER — Emergency Department: Payer: Medicaid Other

## 2018-10-17 ENCOUNTER — Encounter: Payer: Self-pay | Admitting: Emergency Medicine

## 2018-10-17 ENCOUNTER — Other Ambulatory Visit: Payer: Self-pay

## 2018-10-17 ENCOUNTER — Emergency Department
Admission: EM | Admit: 2018-10-17 | Discharge: 2018-10-17 | Discharge status: Against Medical Advice | Payer: Medicaid Other | Attending: Emergency Medicine | Admitting: Emergency Medicine

## 2018-10-17 DIAGNOSIS — F17210 Nicotine dependence, cigarettes, uncomplicated: Secondary | ICD-10-CM | POA: Insufficient documentation

## 2018-10-17 DIAGNOSIS — I10 Essential (primary) hypertension: Secondary | ICD-10-CM | POA: Diagnosis not present

## 2018-10-17 DIAGNOSIS — Z5320 Procedure and treatment not carried out because of patient's decision for unspecified reasons: Secondary | ICD-10-CM | POA: Diagnosis not present

## 2018-10-17 DIAGNOSIS — J449 Chronic obstructive pulmonary disease, unspecified: Secondary | ICD-10-CM | POA: Insufficient documentation

## 2018-10-17 DIAGNOSIS — Z79899 Other long term (current) drug therapy: Secondary | ICD-10-CM | POA: Diagnosis not present

## 2018-10-17 DIAGNOSIS — R0789 Other chest pain: Secondary | ICD-10-CM

## 2018-10-17 LAB — BASIC METABOLIC PANEL
Anion gap: 10 (ref 5–15)
BUN: 10 mg/dL (ref 8–23)
CO2: 26 mmol/L (ref 22–32)
Calcium: 9.3 mg/dL (ref 8.9–10.3)
Chloride: 101 mmol/L (ref 98–111)
Creatinine, Ser: 0.98 mg/dL (ref 0.44–1.00)
GFR calc Af Amer: >60 mL/min (ref >60)
GFR calc non Af Amer: >60 mL/min (ref >60)
Glucose, Bld: 102 mg/dL — ABNORMAL HIGH (ref 70–99)
Potassium: 4.8 mmol/L (ref 3.5–5.1)
Sodium: 137 mmol/L (ref 135–145)

## 2018-10-17 LAB — CBC
HCT: 39.3 % (ref 36.0–46.0)
Hemoglobin: 12.2 g/dL (ref 12.0–15.0)
MCH: 28 pg (ref 26.0–34.0)
MCHC: 31 g/dL (ref 30.0–36.0)
MCV: 90.3 fL (ref 80.0–100.0)
Platelets: 337 10*3/uL (ref 150–400)
RBC: 4.35 MIL/uL (ref 3.87–5.11)
RDW: 14.6 % (ref 11.5–15.5)
WBC: 10.4 10*3/uL (ref 4.0–10.5)
nRBC: 0 % (ref 0.0–0.2)

## 2018-10-17 LAB — TROPONIN I (HIGH SENSITIVITY): Troponin I (High Sensitivity): 3 ng/L (ref <18)

## 2018-10-17 MED ORDER — OXYCODONE-ACETAMINOPHEN 5-325 MG PO TABS
1.0000 | ORAL_TABLET | Freq: Once | ORAL | Status: AC
  Administered 2018-10-17: 1 via ORAL
  Filled 2018-10-17: qty 1

## 2018-10-17 MED ORDER — LIDOCAINE 5 % EX PTCH
1.0000 | MEDICATED_PATCH | CUTANEOUS | Status: DC
  Administered 2018-10-17: 1 via TRANSDERMAL
  Filled 2018-10-17: qty 1

## 2018-10-17 NOTE — ED Notes (Signed)
Pt took medication states she doesn't want a CT or any other tests and hopes she can go home. EDP aware

## 2018-10-17 NOTE — ED Provider Notes (Signed)
[facility] Emergency Department Provider Note  ____________________________________________   I have reviewed the triage vital signs and the nursing notes.   HISTORY  Chief Complaint Chest Pain   History limited by: Not Limited   HPI Jaime Kidd is a 63 y.o. female who presents to the emergency department today because of concern for right lower chest pain. The patient states that she has had multiple falls recently. Has history of balance issues and has seen multiple specialists for this problem. States that when she stood up yesterday she felt a pop in her right lower chest. Since then she has had significant pain to that area. The patient feels the pain is worse with movement. She has not noticed any new shortness of breath or cough. No fevers.    Records reviewed. Per medical record review patient has a history of COPD, asthma.   Past Medical History:  Diagnosis Date  . Arthritis   . Asthma   . Choledocholithiasis   . COPD (chronic obstructive pulmonary disease) ([place])   . Depression   . Duodenitis 06/21/2016  . Fibromyalgia   . Gastritis   . GERD (gastroesophageal reflux disease)   . Headache    migraines  . History of hiatal hernia   . Hx of elevated lipids   . Hypertension   . Pre-diabetes     There are no active problems to display for this patient.   Past Surgical History:  Procedure Laterality Date  . CESAREAN SECTION    . CHOLECYSTECTOMY    . COLONOSCOPY WITH PROPOFOL N/A 06/15/2017   Procedure: COLONOSCOPY WITH PROPOFOL;  Surgeon: Toledo, Benay Pike, MD;  Location: [facility] ENDOSCOPY;  Service: Gastroenterology;  Laterality: N/A;  . ERCP  12/2012 and 2015  . ERCP N/A 11/27/2014   Procedure: ENDOSCOPIC RETROGRADE CHOLANGIOPANCREATOGRAPHY (ERCP);  Surgeon: Hulen Luster, MD;  Location: [facility] ENDOSCOPY;  Service: Gastroenterology;  Laterality: N/A;  . ESOPHAGOGASTRODUODENOSCOPY (EGD) WITH PROPOFOL N/A 06/21/2016   Procedure:  ESOPHAGOGASTRODUODENOSCOPY (EGD) WITH PROPOFOL;  Surgeon: Lollie Sails, MD;  Location: [facility] ENDOSCOPY;  Service: Endoscopy;  Laterality: N/A;  . ESOPHAGOGASTRODUODENOSCOPY (EGD) WITH PROPOFOL N/A 06/15/2017   Procedure: ESOPHAGOGASTRODUODENOSCOPY (EGD) WITH PROPOFOL;  Surgeon: Toledo, Benay Pike, MD;  Location: [facility] ENDOSCOPY;  Service: Gastroenterology;  Laterality: N/A;    Prior to Admission medications   Medication Sig Start Date End Date Taking? Authorizing Provider  albuterol (PROVENTIL HFA;VENTOLIN HFA) 108 (90 BASE) MCG/ACT inhaler Inhale into the lungs every 6 (six) hours as needed for wheezing or shortness of breath.    [provider]  amitriptyline (ELAVIL) 50 MG tablet Take 50 mg by mouth at bedtime.    [provider]  atorvastatin (LIPITOR) 20 MG tablet Take 20 mg by mouth daily.    [provider]  azithromycin (ZITHROMAX Z-PAK) 250 MG tablet Take 2 tablets (500 mg) on  Day 1,  followed by 1 tablet (250 mg) once daily on Days 2 through 5. Patient not taking: Reported on 06/15/2017 09/13/16   Johnn Hai, PA-C  DULoxetine (CYMBALTA) 60 MG capsule Take 60 mg by mouth daily.    [provider]  esomeprazole (NEXIUM) 40 MG capsule Take 40 mg by mouth daily at 12 noon.    [provider]  losartan-hydrochlorothiazide (HYZAAR) 100-25 MG tablet Take 1 tablet by mouth daily.    [provider]  methocarbamol (ROBAXIN) 750 MG tablet Take 750 mg by mouth 2 (two) times daily.    [provider]  metoprolol (LOPRESSOR) 50 MG tablet Take 50 mg by mouth 2 (two) times daily.     [provider]  ondansetron (ZOFRAN-ODT) 4 MG disintegrating tablet Take 4 mg by mouth every 8 (eight) hours as needed for nausea or vomiting.    [provider]  oxyCODONE-acetaminophen (PERCOCET) 5-325 MG tablet Take 1 tablet by mouth every 6 (six) hours as needed for severe pain. Patient not taking: Reported on 06/15/2017 09/13/16    Johnn Hai, PA-C  sucralfate (CARAFATE) 1 g tablet Take 1 g by mouth 4 (four) times daily -  with meals and at bedtime.    [provider]    Allergies Codeine, Darvon [propoxyphene], Gabapentin, Lisinopril, Wellbutrin [bupropion], Sulfa antibiotics, Tetracyclines & related, and Trazodone and nefazodone  No family history on file.  Social History Social History   Tobacco Use  . Smoking status: Current Every Day Smoker    Packs/day: 1.50    Types: Cigarettes  . Smokeless tobacco: Never Used  Substance Use Topics  . Alcohol use: No  . Drug use: No    Review of Systems Constitutional: No fever/chills Eyes: No visual changes. ENT: No sore throat. Cardiovascular: Positive for right lower chest pain. Respiratory: Denies shortness of breath. Gastrointestinal: No abdominal pain.  No nausea, no vomiting.  No diarrhea.   Genitourinary: Negative for dysuria. Musculoskeletal: Negative for back pain. Skin: Negative for rash. Neurological: Negative for headaches, focal weakness or numbness.  ____________________________________________   PHYSICAL EXAM:  VITAL SIGNS: ED Triage Vitals  Enc Vitals Group     BP 10/17/18 1445 (!) 153/67     Pulse Rate 10/17/18 1445 81     Resp --      Temp 10/17/18 1445 97.7 F (36.5 C)     Temp Source 10/17/18 1445 Oral     SpO2 10/17/18 1445 97 %     Weight 10/17/18 1448 135 lb (61.2 kg)     Height 10/17/18 1448 5' (1.524 m)     Head Circumference --      Peak Flow --      Pain Score 10/17/18 1448 10   Constitutional: Alert and oriented.  Eyes: Conjunctivae are normal.  ENT      Head: Normocephalic and atraumatic.      Nose: No congestion/rhinnorhea.      Mouth/Throat: Mucous membranes are moist.      Neck: No stridor. Hematological/Lymphatic/Immunilogical: No cervical lymphadenopathy. Cardiovascular: Normal rate, regular rhythm.  No murmurs, rubs, or gallops.  Respiratory: Normal respiratory effort without tachypnea nor  retractions. Breath sounds are clear and equal bilaterally. No wheezes/rales/rhonchi. Gastrointestinal: Soft and non tender. No rebound. No guarding.  Genitourinary: Deferred Musculoskeletal: Tender to palpation of the right lower ribs.  Neurologic:  Normal speech and language. No gross focal neurologic deficits are appreciated.  Skin:  Skin is warm, dry and intact. No rash noted. Psychiatric: Mood and affect are normal. Speech and behavior are normal. Patient exhibits appropriate insight and judgment.  ____________________________________________    LABS (pertinent positives/negatives)  Trop hs 3 CBC wbc 10.4, hgb 12.2, plt 337 BMP wnl except glu 102 ____________________________________________   EKG  I, Nance Pear, attending physician, personally viewed and interpreted this EKG  EKG Time: 1445 Rate: 73 Rhythm: normal sinus rhythm Axis: normal Intervals: qtc 453 QRS: narrow, q waves V1, V2 ST changes: no st elevation Impression: abnormal ekg  ____________________________________________    RADIOLOGY  CXR No acute abnormality  ____________________________________________   PROCEDURES  Procedures  ____________________________________________  INITIAL IMPRESSION / ASSESSMENT AND PLAN / ED COURSE  Pertinent labs & imaging results that were available during my care of the patient were reviewed by me and considered in my medical decision making (see chart for details).   Patient presented to the emergency department today because of concern for right lower chest pain. Patient has had multiple falls recently. CXR was performed which did not show any acute abnormality. The patient did have some tenderness to her right lower ribs. Did have concern for possible osseous injury. Did order pain medication and lidocaine patch. The patient then apparently told nursing staff that she would like to leave. Patient did leave prior to my change to reassess and discuss any  further treatment, work up that would be reasonable.    ____________________________________________   FINAL CLINICAL IMPRESSION(S) / ED DIAGNOSES  Final diagnoses:  Atypical chest pain     Note: This dictation was prepared with Dragon dictation. Any transcriptional errors that result from this process are unintentional     Nance Pear, MD 10/17/18 2007

## 2018-10-17 NOTE — ED Triage Notes (Signed)
PT arrives with complaints of stabbing right sided chest pain that pt reports started this am.

## 2018-10-17 NOTE — ED Notes (Signed)
This RN went in to check on pt whose call light was on. Pt seen in room crying stating "I just want to leave without treatment." EDP made aware and informed me that pt can sign out AMA. Pt signed physical AMA form and was escorted to lobby.

## 2020-06-16 ENCOUNTER — Other Ambulatory Visit: Payer: Self-pay | Admitting: Gastroenterology

## 2020-06-16 DIAGNOSIS — K219 Gastro-esophageal reflux disease without esophagitis: Secondary | ICD-10-CM

## 2020-06-16 DIAGNOSIS — R1319 Other dysphagia: Secondary | ICD-10-CM

## 2020-06-16 DIAGNOSIS — R140 Abdominal distension (gaseous): Secondary | ICD-10-CM

## 2020-06-16 DIAGNOSIS — R1084 Generalized abdominal pain: Secondary | ICD-10-CM

## 2020-06-16 DIAGNOSIS — K2930 Chronic superficial gastritis without bleeding: Secondary | ICD-10-CM

## 2020-06-18 ENCOUNTER — Other Ambulatory Visit: Payer: Self-pay

## 2020-06-18 ENCOUNTER — Ambulatory Visit
Admission: RE | Admit: 2020-06-18 | Discharge: 2020-06-18 | Disposition: A | Discharge status: Home / Self Care | Payer: Medicare Other | Source: Ambulatory Visit | Attending: Gastroenterology | Admitting: Gastroenterology

## 2020-06-18 DIAGNOSIS — K219 Gastro-esophageal reflux disease without esophagitis: Secondary | ICD-10-CM | POA: Diagnosis present

## 2020-06-18 DIAGNOSIS — R1084 Generalized abdominal pain: Secondary | ICD-10-CM | POA: Diagnosis present

## 2020-06-18 DIAGNOSIS — R1319 Other dysphagia: Secondary | ICD-10-CM | POA: Diagnosis present

## 2020-06-18 DIAGNOSIS — R140 Abdominal distension (gaseous): Secondary | ICD-10-CM | POA: Diagnosis present

## 2020-06-18 DIAGNOSIS — K2930 Chronic superficial gastritis without bleeding: Secondary | ICD-10-CM | POA: Diagnosis present

## 2020-06-20 ENCOUNTER — Other Ambulatory Visit: Payer: Self-pay | Admitting: Gastroenterology

## 2020-06-20 DIAGNOSIS — K56699 Other intestinal obstruction unspecified as to partial versus complete obstruction: Secondary | ICD-10-CM

## 2020-06-20 DIAGNOSIS — R140 Abdominal distension (gaseous): Secondary | ICD-10-CM

## 2020-06-20 DIAGNOSIS — R1084 Generalized abdominal pain: Secondary | ICD-10-CM

## 2020-06-20 DIAGNOSIS — R933 Abnormal findings on diagnostic imaging of other parts of digestive tract: Secondary | ICD-10-CM

## 2020-07-07 ENCOUNTER — Ambulatory Visit
Admission: RE | Admit: 2020-07-07 | Discharge: 2020-07-07 | Disposition: A | Discharge status: Home / Self Care | Payer: Medicare Other | Source: Ambulatory Visit | Attending: Gastroenterology | Admitting: Gastroenterology

## 2020-07-07 ENCOUNTER — Other Ambulatory Visit: Payer: Self-pay

## 2020-07-07 DIAGNOSIS — R140 Abdominal distension (gaseous): Secondary | ICD-10-CM | POA: Insufficient documentation

## 2020-07-07 DIAGNOSIS — R933 Abnormal findings on diagnostic imaging of other parts of digestive tract: Secondary | ICD-10-CM | POA: Diagnosis present

## 2020-07-07 DIAGNOSIS — R1084 Generalized abdominal pain: Secondary | ICD-10-CM | POA: Diagnosis present

## 2020-07-07 DIAGNOSIS — K56699 Other intestinal obstruction unspecified as to partial versus complete obstruction: Secondary | ICD-10-CM | POA: Diagnosis present

## 2020-08-28 ENCOUNTER — Other Ambulatory Visit: Payer: Self-pay | Admitting: Neurology

## 2020-08-28 ENCOUNTER — Other Ambulatory Visit (HOSPITAL_COMMUNITY): Payer: Self-pay | Admitting: Neurology

## 2020-08-28 DIAGNOSIS — G3184 Mild cognitive impairment, so stated: Secondary | ICD-10-CM

## 2020-09-04 ENCOUNTER — Other Ambulatory Visit: Payer: Self-pay

## 2020-09-04 ENCOUNTER — Ambulatory Visit
Admission: RE | Admit: 2020-09-04 | Discharge: 2020-09-04 | Disposition: A | Discharge status: Home / Self Care | Payer: Medicare Other | Source: Ambulatory Visit | Attending: Neurology | Admitting: Neurology

## 2020-09-04 DIAGNOSIS — G3184 Mild cognitive impairment, so stated: Secondary | ICD-10-CM

## 2020-09-06 ENCOUNTER — Ambulatory Visit (HOSPITAL_COMMUNITY): Payer: Medicare Other

## 2021-04-28 ENCOUNTER — Other Ambulatory Visit: Payer: Self-pay

## 2021-04-28 ENCOUNTER — Inpatient Hospital Stay
Admission: EM | Admit: 2021-04-28 | Discharge: 2021-04-30 | DRG: 191 | Disposition: A | Discharge status: Home Health | Payer: Medicare Other | Attending: Hospitalist | Admitting: Hospitalist

## 2021-04-28 ENCOUNTER — Emergency Department: Payer: Medicare Other

## 2021-04-28 DIAGNOSIS — Z20822 Contact with and (suspected) exposure to covid-19: Secondary | ICD-10-CM | POA: Diagnosis present

## 2021-04-28 DIAGNOSIS — F17210 Nicotine dependence, cigarettes, uncomplicated: Secondary | ICD-10-CM | POA: Diagnosis present

## 2021-04-28 DIAGNOSIS — W19XXXA Unspecified fall, initial encounter: Secondary | ICD-10-CM | POA: Diagnosis not present

## 2021-04-28 DIAGNOSIS — R7303 Prediabetes: Secondary | ICD-10-CM | POA: Diagnosis present

## 2021-04-28 DIAGNOSIS — Z841 Family history of disorders of kidney and ureter: Secondary | ICD-10-CM

## 2021-04-28 DIAGNOSIS — Z882 Allergy status to sulfonamides status: Secondary | ICD-10-CM

## 2021-04-28 DIAGNOSIS — R531 Weakness: Secondary | ICD-10-CM | POA: Diagnosis not present

## 2021-04-28 DIAGNOSIS — Z881 Allergy status to other antibiotic agents status: Secondary | ICD-10-CM

## 2021-04-28 DIAGNOSIS — J441 Chronic obstructive pulmonary disease with (acute) exacerbation: Principal | ICD-10-CM | POA: Diagnosis present

## 2021-04-28 DIAGNOSIS — G43909 Migraine, unspecified, not intractable, without status migrainosus: Secondary | ICD-10-CM | POA: Diagnosis present

## 2021-04-28 DIAGNOSIS — I959 Hypotension, unspecified: Secondary | ICD-10-CM | POA: Diagnosis present

## 2021-04-28 DIAGNOSIS — M797 Fibromyalgia: Secondary | ICD-10-CM | POA: Diagnosis present

## 2021-04-28 DIAGNOSIS — R0602 Shortness of breath: Secondary | ICD-10-CM | POA: Diagnosis not present

## 2021-04-28 DIAGNOSIS — W010XXA Fall on same level from slipping, tripping and stumbling without subsequent striking against object, initial encounter: Secondary | ICD-10-CM | POA: Diagnosis present

## 2021-04-28 DIAGNOSIS — N179 Acute kidney failure, unspecified: Secondary | ICD-10-CM | POA: Diagnosis not present

## 2021-04-28 DIAGNOSIS — Z79899 Other long term (current) drug therapy: Secondary | ICD-10-CM

## 2021-04-28 DIAGNOSIS — Z885 Allergy status to narcotic agent status: Secondary | ICD-10-CM

## 2021-04-28 DIAGNOSIS — E785 Hyperlipidemia, unspecified: Secondary | ICD-10-CM | POA: Diagnosis present

## 2021-04-28 DIAGNOSIS — Z5320 Procedure and treatment not carried out because of patient's decision for unspecified reasons: Secondary | ICD-10-CM | POA: Diagnosis not present

## 2021-04-28 DIAGNOSIS — Y92009 Unspecified place in unspecified non-institutional (private) residence as the place of occurrence of the external cause: Secondary | ICD-10-CM

## 2021-04-28 DIAGNOSIS — I129 Hypertensive chronic kidney disease with stage 1 through stage 4 chronic kidney disease, or unspecified chronic kidney disease: Secondary | ICD-10-CM | POA: Diagnosis present

## 2021-04-28 DIAGNOSIS — R0789 Other chest pain: Secondary | ICD-10-CM

## 2021-04-28 DIAGNOSIS — F32A Depression, unspecified: Secondary | ICD-10-CM | POA: Diagnosis present

## 2021-04-28 DIAGNOSIS — Z720 Tobacco use: Secondary | ICD-10-CM | POA: Diagnosis present

## 2021-04-28 DIAGNOSIS — N1831 Chronic kidney disease, stage 3a: Secondary | ICD-10-CM | POA: Diagnosis present

## 2021-04-28 DIAGNOSIS — K2970 Gastritis, unspecified, without bleeding: Secondary | ICD-10-CM | POA: Diagnosis present

## 2021-04-28 DIAGNOSIS — Z7952 Long term (current) use of systemic steroids: Secondary | ICD-10-CM

## 2021-04-28 DIAGNOSIS — I10 Essential (primary) hypertension: Secondary | ICD-10-CM | POA: Diagnosis present

## 2021-04-28 DIAGNOSIS — K219 Gastro-esophageal reflux disease without esophagitis: Secondary | ICD-10-CM | POA: Diagnosis present

## 2021-04-28 DIAGNOSIS — Z888 Allergy status to other drugs, medicaments and biological substances status: Secondary | ICD-10-CM

## 2021-04-28 DIAGNOSIS — M1990 Unspecified osteoarthritis, unspecified site: Secondary | ICD-10-CM | POA: Diagnosis present

## 2021-04-28 LAB — RESPIRATORY PANEL BY PCR

## 2021-04-28 LAB — URINALYSIS, ROUTINE W REFLEX MICROSCOPIC
Bacteria, UA: NONE SEEN
Bilirubin Urine: NEGATIVE
Glucose, UA: NEGATIVE mg/dL
Ketones, ur: NEGATIVE mg/dL
Leukocytes,Ua: NEGATIVE
Nitrite: NEGATIVE
Protein, ur: 30 mg/dL — AB
Specific Gravity, Urine: 1.017 (ref 1.005–1.030)
pH: 6 (ref 5.0–8.0)

## 2021-04-28 LAB — EXPECTORATED SPUTUM ASSESSMENT W GRAM STAIN, RFLX TO RESP C

## 2021-04-28 LAB — CBC WITH DIFFERENTIAL/PLATELET
Abs Immature Granulocytes: 0.05 10*3/uL (ref 0.00–0.07)
Basophils Absolute: 0.1 10*3/uL (ref 0.0–0.1)
Basophils Relative: 0 %
Eosinophils Absolute: 0.1 10*3/uL (ref 0.0–0.5)
Eosinophils Relative: 0 %
HCT: 32.6 % — ABNORMAL LOW (ref 36.0–46.0)
Hemoglobin: 10.2 g/dL — ABNORMAL LOW (ref 12.0–15.0)
Immature Granulocytes: 0 %
Lymphocytes Relative: 12 %
Lymphs Abs: 1.4 10*3/uL (ref 0.7–4.0)
MCH: 26.6 pg (ref 26.0–34.0)
MCHC: 31.3 g/dL (ref 30.0–36.0)
MCV: 85.1 fL (ref 80.0–100.0)
Monocytes Absolute: 1.7 10*3/uL — ABNORMAL HIGH (ref 0.1–1.0)
Monocytes Relative: 14 %
Neutro Abs: 8.7 10*3/uL — ABNORMAL HIGH (ref 1.7–7.7)
Neutrophils Relative %: 74 %
Platelets: 315 10*3/uL (ref 150–400)
RBC: 3.83 MIL/uL — ABNORMAL LOW (ref 3.87–5.11)
RDW: 14.7 % (ref 11.5–15.5)
WBC: 12 10*3/uL — ABNORMAL HIGH (ref 4.0–10.5)
nRBC: 0 % (ref 0.0–0.2)

## 2021-04-28 LAB — BASIC METABOLIC PANEL
Anion gap: 8 (ref 5–15)
BUN: 12 mg/dL (ref 8–23)
CO2: 24 mmol/L (ref 22–32)
Calcium: 8.8 mg/dL — ABNORMAL LOW (ref 8.9–10.3)
Chloride: 99 mmol/L (ref 98–111)
Creatinine, Ser: 1.41 mg/dL — ABNORMAL HIGH (ref 0.44–1.00)
GFR, Estimated: 41 mL/min — ABNORMAL LOW (ref >60)
Glucose, Bld: 140 mg/dL — ABNORMAL HIGH (ref 70–99)
Potassium: 4 mmol/L (ref 3.5–5.1)
Sodium: 131 mmol/L — ABNORMAL LOW (ref 135–145)

## 2021-04-28 LAB — RESP PANEL BY RT-PCR (FLU A&B, COVID) ARPGX2
Influenza A by PCR: NEGATIVE
Influenza B by PCR: NEGATIVE
SARS Coronavirus 2 by RT PCR: NEGATIVE

## 2021-04-28 LAB — TROPONIN I (HIGH SENSITIVITY)
Troponin I (High Sensitivity): 9 ng/L (ref <18)
Troponin I (High Sensitivity): 7 ng/L (ref <18)

## 2021-04-28 LAB — LACTIC ACID, PLASMA: Lactic Acid, Venous: 0.6 mmol/L (ref 0.5–1.9)

## 2021-04-28 LAB — MAGNESIUM: Magnesium: 2.1 mg/dL (ref 1.7–2.4)

## 2021-04-28 LAB — PROCALCITONIN: Procalcitonin: 0.19 ng/mL

## 2021-04-28 LAB — BRAIN NATRIURETIC PEPTIDE: B Natriuretic Peptide: 33.4 pg/mL (ref 0.0–100.0)

## 2021-04-28 MED ORDER — SODIUM CHLORIDE 0.9 % IV SOLN: INTRAVENOUS | Status: DC

## 2021-04-28 MED ORDER — SODIUM CHLORIDE 0.9 % IV SOLN
500.0000 mg | Freq: Once | INTRAVENOUS | Status: AC
  Administered 2021-04-28: 500 mg via INTRAVENOUS
  Filled 2021-04-28: qty 5

## 2021-04-28 MED ORDER — IPRATROPIUM-ALBUTEROL 0.5-2.5 (3) MG/3ML IN SOLN
3.0000 mL | Freq: Once | RESPIRATORY_TRACT | Status: AC
  Administered 2021-04-28: 3 mL via RESPIRATORY_TRACT
  Filled 2021-04-28: qty 3

## 2021-04-28 MED ORDER — AZITHROMYCIN 250 MG PO TABS: ORAL_TABLET | ORAL | 0 refills | Status: DC

## 2021-04-28 MED ORDER — ACETAMINOPHEN 325 MG PO TABS
650.0000 mg | ORAL_TABLET | Freq: Four times a day (QID) | ORAL | Status: DC | PRN
  Administered 2021-04-28 – 2021-04-29 (×3): 650 mg via ORAL
  Filled 2021-04-28 (×3): qty 2

## 2021-04-28 MED ORDER — SODIUM CHLORIDE 0.9 % IV BOLUS
500.0000 mL | Freq: Once | INTRAVENOUS | Status: AC
Start: 2021-04-28 — End: 2021-04-28
  Administered 2021-04-28: 500 mL via INTRAVENOUS

## 2021-04-28 MED ORDER — HYDRALAZINE HCL 20 MG/ML IJ SOLN: 5.0000 mg | INTRAMUSCULAR | Status: DC | PRN

## 2021-04-28 MED ORDER — DIPHENHYDRAMINE HCL 50 MG/ML IJ SOLN
12.5000 mg | Freq: Three times a day (TID) | INTRAMUSCULAR | Status: DC | PRN
Start: 2021-04-28 — End: 2021-04-30

## 2021-04-28 MED ORDER — ONDANSETRON HCL 4 MG/2ML IJ SOLN: 4.0000 mg | Freq: Three times a day (TID) | INTRAMUSCULAR | Status: DC | PRN

## 2021-04-28 MED ORDER — SODIUM CHLORIDE 0.9 % IV BOLUS (SEPSIS)
1000.0000 mL | Freq: Once | INTRAVENOUS | Status: AC
  Administered 2021-04-28: 1000 mL via INTRAVENOUS

## 2021-04-28 MED ORDER — ALUM & MAG HYDROXIDE-SIMETH 200-200-20 MG/5ML PO SUSP
30.0000 mL | Freq: Four times a day (QID) | ORAL | Status: DC | PRN
  Administered 2021-04-28: 30 mL via ORAL
  Filled 2021-04-28: qty 30

## 2021-04-28 MED ORDER — PANTOPRAZOLE SODIUM 40 MG PO TBEC
40.0000 mg | DELAYED_RELEASE_TABLET | Freq: Every day | ORAL | Status: DC
  Administered 2021-04-28 – 2021-04-30 (×3): 40 mg via ORAL
  Filled 2021-04-28 (×3): qty 1

## 2021-04-28 MED ORDER — METHYLPREDNISOLONE SODIUM SUCC 125 MG IJ SOLR
80.0000 mg | INTRAMUSCULAR | Status: DC
  Administered 2021-04-28: 80 mg via INTRAVENOUS
  Filled 2021-04-28: qty 2

## 2021-04-28 MED ORDER — METHYLPREDNISOLONE SODIUM SUCC 125 MG IJ SOLR
125.0000 mg | Freq: Once | INTRAMUSCULAR | Status: AC
  Administered 2021-04-28: 125 mg via INTRAVENOUS
  Filled 2021-04-28: qty 2

## 2021-04-28 MED ORDER — DM-GUAIFENESIN ER 30-600 MG PO TB12
1.0000 | ORAL_TABLET | Freq: Two times a day (BID) | ORAL | Status: DC | PRN
  Administered 2021-04-28 – 2021-04-29 (×2): 1 via ORAL
  Filled 2021-04-28 (×2): qty 1

## 2021-04-28 MED ORDER — ATORVASTATIN CALCIUM 20 MG PO TABS
40.0000 mg | ORAL_TABLET | Freq: Every evening | ORAL | Status: DC
  Administered 2021-04-28 – 2021-04-29 (×2): 40 mg via ORAL
  Filled 2021-04-28 (×3): qty 2

## 2021-04-28 MED ORDER — PREDNISONE 10 MG PO TABS: 60.0000 mg | ORAL_TABLET | Freq: Every day | ORAL | 0 refills | Status: DC

## 2021-04-28 MED ORDER — AZITHROMYCIN 500 MG PO TABS
250.0000 mg | ORAL_TABLET | Freq: Every day | ORAL | Status: DC
  Administered 2021-04-29 – 2021-04-30 (×2): 250 mg via ORAL
  Filled 2021-04-28 (×2): qty 1

## 2021-04-28 MED ORDER — IPRATROPIUM-ALBUTEROL 0.5-2.5 (3) MG/3ML IN SOLN
3.0000 mL | RESPIRATORY_TRACT | Status: DC
  Administered 2021-04-28 (×3): 3 mL via RESPIRATORY_TRACT
  Filled 2021-04-28 (×4): qty 3

## 2021-04-28 MED ORDER — ENOXAPARIN SODIUM 40 MG/0.4ML IJ SOSY
40.0000 mg | PREFILLED_SYRINGE | INTRAMUSCULAR | Status: DC
  Administered 2021-04-28 – 2021-04-29 (×2): 40 mg via SUBCUTANEOUS
  Filled 2021-04-28 (×2): qty 0.4

## 2021-04-28 MED ORDER — AMITRIPTYLINE HCL 25 MG PO TABS
75.0000 mg | ORAL_TABLET | Freq: Every day | ORAL | Status: DC
  Administered 2021-04-28 – 2021-04-29 (×2): 75 mg via ORAL
  Filled 2021-04-28 (×2): qty 3

## 2021-04-28 MED ORDER — ALBUTEROL SULFATE (2.5 MG/3ML) 0.083% IN NEBU
2.5000 mg | INHALATION_SOLUTION | RESPIRATORY_TRACT | Status: DC | PRN
Start: 2021-04-28 — End: 2021-04-30

## 2021-04-28 MED ORDER — NICOTINE 21 MG/24HR TD PT24
21.0000 mg | MEDICATED_PATCH | Freq: Every day | TRANSDERMAL | Status: DC
  Administered 2021-04-28 – 2021-04-30 (×3): 21 mg via TRANSDERMAL
  Filled 2021-04-28 (×3): qty 1

## 2021-04-28 MED ORDER — ALBUTEROL SULFATE HFA 108 (90 BASE) MCG/ACT IN AERS: 2.0000 | INHALATION_SPRAY | RESPIRATORY_TRACT | 0 refills | Status: DC | PRN

## 2021-04-28 MED ORDER — SODIUM CHLORIDE 0.9 % IV BOLUS (SEPSIS)
500.0000 mL | Freq: Once | INTRAVENOUS | Status: AC
  Administered 2021-04-28: 500 mL via INTRAVENOUS

## 2021-04-28 MED ORDER — METHOCARBAMOL 500 MG PO TABS
500.0000 mg | ORAL_TABLET | Freq: Two times a day (BID) | ORAL | Status: DC | PRN
  Administered 2021-04-29: 500 mg via ORAL
  Filled 2021-04-28 (×3): qty 1

## 2021-04-28 NOTE — Evaluation (Signed)
Physical Therapy Evaluation ?Patient Details ?Name: Jaime Kidd ?MRN: [id number] ?DOB: [birth year] ?Today's Date: 04/28/2021 ? ?History of Present Illness ? Jaime Kidd is a 66 y.o. female with medical history significant of COPD not on oxygen normally, hypertension, hyperlipidemia, asthma, GERD, depression, duodenitis, fibromyalgia, migraine headache, tobacco abuse, CKD-3A, who presents with shortness breath. ?  ?Clinical Impression ? Pt admitted with above diagnosis. Pt received upright in bed agreeable to PT intervention. Pt on RA with Spo2 at 95% at rest and throughout mobility. Pt reports being indep at baseline with household mobility but endorses frequent falls stating she knows she should be using her RW. Requires assist in transportation, bill management, cooking meals, and groceries which DIL assists in. Overall mobility limited due to pt being hooked to IV at ED cot and RN not available. Pt indep with bed mobility to seated EOB and supervision to stand to RW ambulating 8' at bedside. Pt's LE's slightly tremulous which pt reports is due to increased weakness currently but able to maintain balance with UE support on RW. Pt denies dizziness throughout session and orthostatics grossly negative from seated to standing position with elevation in BP post intervention (see below). Pt returning to seated EOB and educated on energy conservation techniques, LE therex, safe use of DME within home environment to prevent falls and maintain independence with household mobility and ADL completion. Pt verbalizing understanding, all needs in reach at this time. Pt currently with functional limitations due to the deficits listed below (see PT Problem List). Pt will benefit from skilled PT to increase their independence and safety with mobility to allow discharge to the venue listed below.   ?  ?Orthostatic VS for the past 24 hrs: ? BP- Lying BP- Sitting BP- Standing at 0 minutes  ?04/28/21 1151 129/74 103/85 102/88   ? ? BP post intervention: 152/81 mm Hg  ? ?Recommendations for follow up therapy are one component of a multi-disciplinary discharge planning process, led by the attending physician.  Recommendations may be updated based on patient status, additional functional criteria and insurance authorization. ? ?Follow Up Recommendations Home health PT ? ?  ?Assistance Recommended at Discharge Intermittent Supervision/Assistance  ?Patient can return home with the following ? Assistance with cooking/housework;Assist for transportation;Help with stairs or ramp for entrance ? ?  ?Equipment Recommendations None recommended by PT  ?Recommendations for Other Services ?    ?  ?Functional Status Assessment Patient has had a recent decline in their functional status and demonstrates the ability to make significant improvements in function in a reasonable and predictable amount of time.  ? ?  ?Precautions / Restrictions Precautions ?Precautions: Fall ?Restrictions ?Weight Bearing Restrictions: No  ? ?  ? ?Mobility ? Bed Mobility ?Overal bed mobility: Independent ?  ?  ?  ?  ?  ?  ?  ?Patient Response: Cooperative ? ?Transfers ?Overall transfer level: Needs assistance ?Equipment used: Rolling walker (2 wheels) ?Transfers: Sit to/from Stand ?Sit to Stand: Supervision, From elevated surface ?  ?  ?  ?  ?  ?General transfer comment: from elevated ED bed ?  ? ?Ambulation/Gait ?Ambulation/Gait assistance: Supervision ?Gait Distance (Feet): 8 Feet ?Assistive device: Rolling walker (2 wheels) ?Gait Pattern/deviations: Step-through pattern ?  ?  ?  ?General Gait Details: Limited due to RN not available and pt being connected to IV to ED cot. Stable with RW. ? ?Stairs ?  ?  ?  ?  ?  ? ?Wheelchair Mobility ?  ? ?Modified Rankin (Stroke Patients  Only) ?  ? ?  ? ?Balance Overall balance assessment: Needs assistance ?Sitting-balance support: Bilateral upper extremity supported, Feet unsupported ?Sitting balance-Leahy Scale: Fair ?  ?  ?Standing  balance support: No upper extremity supported ?Standing balance-Leahy Scale: Fair ?  ?  ?  ?  ?  ?  ?  ?  ?  ?  ?  ?  ?   ? ? ? ?Pertinent Vitals/Pain Pain Assessment ?Pain Assessment: No/denies pain  ? ? ?Home Living Family/patient expects to be discharged to:: Private residence ?Living Arrangements: Alone ?Available Help at Discharge: Family;Available PRN/intermittently ?Type of Home: Mobile home ?Home Access: Stairs to enter ?Entrance Stairs-Rails: Right (For back stairs) ?Entrance Stairs-Number of Steps: 3 in front,  6 in back ?  ?Home Layout: One level ?Home Equipment: Conservation officer, nature (2 wheels);Shower seat;Other (comment) (variety of canes) ?   ?  ?Prior Function Prior Level of Function : Independent/Modified Independent;Needs assist ?  ?  ?  ?Physical Assist : ADLs (physical) ?  ?ADLs (physical): IADLs ?  ?ADLs Comments: Pt gets assist from DIL for transportation, meals, groceries. Pt cooks "small things" ?  ? ? ?Hand Dominance  ?   ? ?  ?Extremity/Trunk Assessment  ? Upper Extremity Assessment ?Upper Extremity Assessment: Overall WFL for tasks assessed ?  ? ?Lower Extremity Assessment ?Lower Extremity Assessment: Generalized weakness ?  ? ?   ?Communication  ? Communication: No difficulties  ?Cognition Arousal/Alertness: Awake/alert ?Behavior During Therapy: [facility] for tasks assessed/performed ?Overall Cognitive Status: Within Functional Limits for tasks assessed ?  ?  ?  ?  ?  ?  ?  ?  ?  ?  ?  ?  ?  ?  ?  ?  ?  ?  ?  ? ?  ?General Comments General comments (skin integrity, edema, etc.): SPO2 >/= 95% throughout session ? ?  ?Exercises Other Exercises ?Other Exercises: Role of PT in acute setting, d/c recs, energy conservation techniques, need for use of AD to prevent falls, LE therex  ? ?Assessment/Plan  ?  ?PT Assessment Patient needs continued PT services  ?PT Problem List Decreased strength;Decreased activity tolerance ? ?   ?  ?PT Treatment Interventions DME instruction;Therapeutic exercise;Gait  training;Balance training;Stair training;Neuromuscular re-education;Functional mobility training;Therapeutic activities;Patient/family education   ? ?PT Goals (Current goals can be found in the Care Plan section)  ?Acute Rehab PT Goals ?Patient Stated Goal: improve strength and prevent falls at home to maintain independence ?PT Goal Formulation: With patient ?Time For Goal Achievement: 05/12/21 ?Potential to Achieve Goals: Good ? ?  ?Frequency Min 2X/week ?  ? ? ?Co-evaluation   ?  ?  ?  ?  ? ? ?  ?AM-PAC PT "6 Clicks" Mobility  ?Outcome Measure Help needed turning from your back to your side while in a flat bed without using bedrails?: None ?Help needed moving from lying on your back to sitting on the side of a flat bed without using bedrails?: None ?Help needed moving to and from a bed to a chair (including a wheelchair)?: A Little ?Help needed standing up from a chair using your arms (e.g., wheelchair or bedside chair)?: A Little ?Help needed to walk in hospital room?: A Little ?Help needed climbing 3-5 steps with a railing? : A Lot ?6 Click Score: 19 ? ?  ?End of Session Equipment Utilized During Treatment: Gait belt ?Activity Tolerance: Patient tolerated treatment well ?Patient left: in bed;with call bell/phone within reach ?Nurse Communication: Mobility status ?PT Visit Diagnosis: Unsteadiness  on feet (R26.81);Other abnormalities of gait and mobility (R26.89);Muscle weakness (generalized) (M62.81) ?  ? ?Time: 3903-0092 ?PT Time Calculation (min) (ACUTE ONLY): 30 min ? ? ?Charges:   PT Evaluation ?$PT Eval Moderate Complexity: 1 Mod ?PT Treatments ?$Self Care/Home Management: 8-22 ?  ?   ? ?Salem Caster. Fairly IV, PT, DPT ?Physical Therapist- [place]  ?[facility]  ?04/28/2021, 12:00 PM ? ?

## 2021-04-28 NOTE — ED Notes (Signed)
Hospitalist at bedside 

## 2021-04-28 NOTE — ED Notes (Signed)
Dr. Ward at the bedside.  

## 2021-04-28 NOTE — H&P (Signed)
?History and Physical  ? ? ?Jaime Kidd:[id number] DOB: 05/07/1955 DOA: 04/28/2021 ? ?Referring MD/NP/PA:  ? ?PCP: [facility]  ? ?Patient coming from:  The patient is coming from home.   ? ?Chief Complaint: SOB ? ?HPI: Jaime Kidd is a 66 y.o. female with medical history significant of COPD not on oxygen normally, hypertension, hyperlipidemia, asthma, GERD, depression, duodenitis, fibromyalgia, migraine headache, tobacco abuse, CKD-3A, who presents with shortness breath. ? ?Patient states that she has shortness of breath for more than 5 days, which has been progressively worsening.  Patient has productive cough with green-colored sputum production and wheezing.  Denies chest pain, no fever or chills.  Patient does not have nausea, vomiting, diarrhea or abdominal pain.  No symptoms of UTI.  Patient states that she has generalized weakness. She fell accidentally last night without significant injury.  She states that her legs were buckled. She strongly denies any head or neck injury.  She refused CT of head and neck.  She does not have unilateral numbness or tinglings in extremities, no facial droop or slurred speech. ? ?Data Reviewed and ED Course: pt was found to have WBC 12.0, negative COVID PCR, worsening renal function, lactic acid 0.6, negative urinalysis, BNP 33.4, troponin level 9, 7, temperature normal, soft blood pressure 99/57, heart rate 86, RR 26, oxygen saturation at the lower 90s, which improved to 98% on 2 L oxygen.  Chest x-ray negative.  Patient is placed on telemetry bed for observation. ? ? ?EKG: I have personally reviewed.  Sinus rhythm, QTc 565, early R wave progression, minimal T wave inversion in V1-V5. ? ? ?Review of Systems:  ? ?General: no fevers, chills, no body weight gain, has fatigue ?HEENT: no blurry vision, hearing changes or sore throat ?Respiratory: has dyspnea, coughing, wheezing ?CV: no chest pain, no palpitations ?GI: no nausea, vomiting, abdominal pain,  diarrhea, constipation ?GU: no dysuria, burning on urination, increased urinary frequency, hematuria  ?Ext: no leg edema ?Neuro: no unilateral weakness, numbness, or tingling, no vision change or hearing loss. Has fall ?Skin: no rash, no skin tear. ?MSK: No muscle spasm, no deformity, no limitation of range of movement in spin ?Heme: No easy bruising.  ?Travel history: No recent long distant travel. ? ? ?Allergy:  ?Allergies  ?Allergen Reactions  ? Codeine Nausea And Vomiting  ? Darvon [Propoxyphene] Nausea And Vomiting  ? Gabapentin Other (See Comments)  ?  Hallucination ?  ? Lisinopril   ? Wellbutrin [Bupropion] Other (See Comments)  ?  "Makes me mean"  ? Sulfa Antibiotics Rash  ? Tetracyclines & Related Rash  ? Trazodone And Nefazodone Rash  ? ? ?Past Medical History:  ?Diagnosis Date  ? Arthritis   ? Asthma   ? Choledocholithiasis   ? COPD (chronic obstructive pulmonary disease) ([place])   ? Depression   ? Duodenitis 06/21/2016  ? Fibromyalgia   ? Gastritis   ? GERD (gastroesophageal reflux disease)   ? Headache   ? migraines  ? History of hiatal hernia   ? Hx of elevated lipids   ? Hypertension   ? Pre-diabetes   ? ? ?Past Surgical History:  ?Procedure Laterality Date  ? CESAREAN SECTION    ? CHOLECYSTECTOMY    ? COLONOSCOPY WITH PROPOFOL N/A 06/15/2017  ? Procedure: COLONOSCOPY WITH PROPOFOL;  Surgeon: Toledo, Benay Pike, MD;  Location: [facility] ENDOSCOPY;  Service: Gastroenterology;  Laterality: N/A;  ? ERCP  12/2012 and 2015  ? ERCP N/A 11/27/2014  ?  Procedure: ENDOSCOPIC RETROGRADE CHOLANGIOPANCREATOGRAPHY (ERCP);  Surgeon: Hulen Luster, MD;  Location: [facility] ENDOSCOPY;  Service: Gastroenterology;  Laterality: N/A;  ? ESOPHAGOGASTRODUODENOSCOPY (EGD) WITH PROPOFOL N/A 06/21/2016  ? Procedure: ESOPHAGOGASTRODUODENOSCOPY (EGD) WITH PROPOFOL;  Surgeon: Lollie Sails, MD;  Location: [facility] ENDOSCOPY;  Service: Endoscopy;  Laterality: N/A;  ? ESOPHAGOGASTRODUODENOSCOPY (EGD) WITH PROPOFOL N/A 06/15/2017  ? Procedure:  ESOPHAGOGASTRODUODENOSCOPY (EGD) WITH PROPOFOL;  Surgeon: Toledo, Benay Pike, MD;  Location: [facility] ENDOSCOPY;  Service: Gastroenterology;  Laterality: N/A;  ? ? ?Social History:  reports that she has been smoking cigarettes. She has been smoking an average of 1.5 packs per day. She has never used smokeless tobacco. She reports that she does not drink alcohol and does not use drugs. ? ?Family History:  ?Family History  ?Problem Relation Age of Onset  ? Kidney disease Mother   ? Arthritis Father   ?  ? ?Prior to Admission medications   ?Medication Sig Start Date End Date Taking? Authorizing Provider  ?albuterol (VENTOLIN HFA) 108 (90 Base) MCG/ACT inhaler Inhale 2 puffs into the lungs every 4 (four) hours as needed for wheezing or shortness of breath. 04/28/21  Yes Ward, Delice Bison, DO  ?azithromycin (ZITHROMAX Z-PAK) 250 MG tablet Take 2 tablets (500 mg) on  Day 1,  followed by 1 tablet (250 mg) once daily on Days 2 through 5. 04/28/21 05/03/21 Yes Ward, Delice Bison, DO  ?predniSONE (DELTASONE) 10 MG tablet Take 6 tablets (60 mg total) by mouth daily. 04/28/21  Yes Ward, Delice Bison, DO  ?albuterol (PROVENTIL HFA;VENTOLIN HFA) 108 (90 BASE) MCG/ACT inhaler Inhale into the lungs every 6 (six) hours as needed for wheezing or shortness of breath.    [provider]  ?amitriptyline (ELAVIL) 50 MG tablet Take 50 mg by mouth at bedtime.    [provider]  ?atorvastatin (LIPITOR) 20 MG tablet Take 20 mg by mouth daily.    [provider]  ?DULoxetine (CYMBALTA) 60 MG capsule Take 60 mg by mouth daily.    [provider]  ?esomeprazole (NEXIUM) 40 MG capsule Take 40 mg by mouth daily at 12 noon.    [provider]  ?losartan-hydrochlorothiazide (HYZAAR) 100-25 MG tablet Take 1 tablet by mouth daily.    [provider]  ?methocarbamol (ROBAXIN) 750 MG tablet Take 750 mg by mouth 2 (two) times daily.    [provider]  ?metoprolol (LOPRESSOR) 50 MG tablet Take 50 mg by  mouth 2 (two) times daily.     [provider]  ?ondansetron (ZOFRAN-ODT) 4 MG disintegrating tablet Take 4 mg by mouth every 8 (eight) hours as needed for nausea or vomiting.    [provider]  ?oxyCODONE-acetaminophen (PERCOCET) 5-325 MG tablet Take 1 tablet by mouth every 6 (six) hours as needed for severe pain. ?Patient not taking: Reported on 06/15/2017 09/13/16   Johnn Hai, PA-C  ?sucralfate (CARAFATE) 1 g tablet Take 1 g by mouth 4 (four) times daily -  with meals and at bedtime.    [provider]  ? ? ?Physical Exam: ?Vitals:  ? 04/28/21 0630 04/28/21 0700 04/28/21 0730 04/28/21 0800  ?BP: 109/88 128/69 101/88 95/60  ?Pulse: 82 77 78 79  ?Resp: (!) 21 (!) 22 19 (!) 22  ?Temp:      ?TempSrc:      ?SpO2: 95% 90% 93% 100%  ?Weight:      ?Height:      ? ?General: Not in acute distress ?HEENT: ?  Eyes: PERRL, EOMI, no scleral icterus. ?      ENT: No discharge from the ears and nose, no pharynx injection, no tonsillar enlargement.  ?      Neck: No JVD, no bruit, no mass felt. ?Heme: No neck lymph node enlargement. ?Cardiac: S1/S2, RRR, No murmurs, No gallops or rubs. ?Respiratory: Has wheezing bilaterally ?GI: Soft, nondistended, nontender, no rebound pain, no organomegaly, BS present. ?GU: No hematuria ?Ext: No pitting leg edema bilaterally. 1+DP/PT pulse bilaterally. ?Musculoskeletal: No joint deformities, No joint redness or warmth, no limitation of ROM in spin. ?Skin: No rashes.  ?Neuro: Alert, oriented X3, cranial nerves II-XII grossly intact, moves all extremities normally.  ?Psych: Patient is not psychotic, no suicidal or hemocidal ideation. ? ?Labs on Admission: I have personally reviewed following labs and imaging studies ? ?CBC: ?Recent Labs  ?Lab 04/28/21 ?0352  ?WBC 12.0*  ?NEUTROABS 8.7*  ?HGB 10.2*  ?HCT 32.6*  ?MCV 85.1  ?PLT 315  ? ?Basic Metabolic Panel: ?Recent Labs  ?Lab 04/28/21 ?0352 04/28/21 ?0403  ?NA 131*  --   ?K 4.0  --   ?CL 99  --   ?CO2 24  --    ?GLUCOSE 140*  --   ?BUN 12  --   ?CREATININE 1.41*  --   ?CALCIUM 8.8*  --   ?MG  --  2.1  ? ?GFR: ?Estimated Creatinine Clearance: 32.8 mL/min (A) (by C-G formula based on SCr of 1.41 mg/dL (H)). ?Liver Function T

## 2021-04-28 NOTE — ED Notes (Signed)
PT in with pt.

## 2021-04-28 NOTE — Care Management Obs Status (Signed)
MEDICARE OBSERVATION STATUS NOTIFICATION ? ? ?Patient Details  ?Name: Jaime Kidd ?MRN: [id number] ?Date of Birth: [birth year] ? ? ?Medicare Observation Status Notification Given:  Yes ? ? ? ?Shelbie Hutching, RN ?04/28/2021, 1:44 PM ?

## 2021-04-28 NOTE — TOC Initial Note (Signed)
Transition of Care (TOC) - Initial/Assessment Note  ? ? ?Patient Details  ?Name: Jaime Kidd ?MRN: [id number] ?Date of Birth: 02-05-1956 ? ?Transition of Care (TOC) CM/SW Contact:    ?Jaime Hutching, RN ?Phone Number: ?04/28/2021, 2:05 PM ? ?Clinical Narrative:                 ?Patient placed under observation for COPD exacerbation initially on acute O2 at 2L patient now tolerating room air.  RNCM met with patient at the bedside, introduced self and explained role.  Patient is from home where she lives alone.  Patient reports that she has family that lives close by, her husband died a little over a year ago.  Patient is current with her PCP at [facility] in [place].  She gets prescriptions at [facility].   Daughter in law, Jaime Kidd is her patient's emergency contact, Jaime Kidd also provides patient's transportation to appointments. ?Patient agrees to home health services for PT.  No preference in agency.  Jaime Kidd.   ? ? ? ?Expected Discharge Plan: [place] ?Barriers to Discharge: Continued Medical Work up ? ? ?Patient Goals and CMS Choice ?Patient states their goals for this hospitalization and ongoing recovery are:: to go home and get in her own bed ?CMS Medicare.gov Compare Post Acute Care list provided to:: Patient ?Choice offered to / list presented to : Patient ? ?Expected Discharge Plan and Services ?Expected Discharge Plan: [place] ?  ?Discharge Planning Services: CM Consult ?Post Acute Care Choice: Home Health ?Living arrangements for the past 2 months: Mobile Home ?                ?DME Arranged: N/A ?DME Agency: NA ?  ?  ?  ?HH Arranged: PT ?Jaime Agency: [facility] ?Date HH Agency Contacted: 04/28/21 ?Time Burna: 1400 ?  ? ?Prior Living Arrangements/Services ?Living arrangements for the past 2 months: Mobile Home ?Lives with:: Self ?Patient language and need for interpreter reviewed:: Yes ?Do you feel  safe going back to the place where you live?: Yes      ?Need for Family Participation in Patient Care: Yes (Comment) (COPD) ?Care giver support system in place?: Yes (comment) (daughter and son) ?Current home services: DME (Walker, cane, bedside commodes) ?Criminal Activity/Legal Involvement Pertinent to Current Situation/Hospitalization: No - Comment as needed ? ?Activities of Daily Living ?Home Assistive Devices/Equipment: Gilford Rile (specify type), Eyeglasses, Shower chair with back, Bedside commode/3-in-1, Nebulizer ?ADL Screening (condition at time of admission) ?Patient's cognitive ability adequate to safely complete daily activities?: Yes ?Is the patient deaf or have difficulty hearing?: No ?Does the patient have difficulty seeing, even when wearing glasses/contacts?: No ?Does the patient have difficulty concentrating, remembering, or making decisions?: No ?Patient able to express need for assistance with ADLs?: Yes ?Does the patient have difficulty dressing or bathing?: No ?Independently performs ADLs?: Yes (appropriate for developmental age) ?Does the patient have difficulty walking or climbing stairs?: Yes ?Weakness of Legs: Both ?Weakness of Arms/Hands: None ? ?Permission Sought/Granted ?Permission sought to share information with : Case Manager, Family Supports, Other (comment) ?Permission granted to share information with : Yes, Verbal Permission Granted ? Share Information with NAME: Jaime Kidd ? Permission granted to share info w AGENCY: home health agency ? Permission granted to share info w Relationship: daughter ? Permission granted to share info w Contact Information: [phone] ? ?Emotional Assessment ?Appearance:: Appears stated age ?Attitude/Demeanor/Rapport: Engaged ?Affect (typically observed):  Accepting ?Orientation: : Oriented to Self, Oriented to Place, Oriented to  Time, Oriented to Situation ?Alcohol / Substance Use: Not Applicable ?Psych Involvement: No (comment) ? ?Admission diagnosis:   COPD exacerbation ([place]) [J44.1] ?Patient Active Problem List  ? Diagnosis Date Noted  ? COPD exacerbation ([place]) 04/28/2021  ? HTN (hypertension) 04/28/2021  ? HLD (hyperlipidemia) 04/28/2021  ? Depression 04/28/2021  ? GERD (gastroesophageal reflux disease) 04/28/2021  ? Tobacco abuse 04/28/2021  ? Acute renal failure superimposed on stage 3a chronic kidney disease ([place]) 04/28/2021  ? Fall   ? Generalized weakness   ? ?[facility]:  [facility] ?Pharmacy:   ?[facility] [place], [place] AT [facility] [address] ?[place] ?[place] [address] ?Phone: [phone] Fax: [phone] ? ? ? ? ?Social Determinants of Health (SDOH) Interventions ?  ? ?Readmission Risk Interventions ?No flowsheet data found. ? ? ?

## 2021-04-28 NOTE — ED Triage Notes (Addendum)
Patient arrived via EMS from home with c/c of shortness of breath since Friday. States she also has a productive cough with greenish colored sputum. Pt had a low grade fever yesterday, but is afebrile presently. O2/2L nasal cannula applied for comfort. O2 sats 93-94% on room air. Denies N,V,D. Pt does report CP from continuous coughing. NSR noted on monitor. Pt had a fall yesterday and states that "her legs buckled". No LOC ?

## 2021-04-28 NOTE — ED Notes (Signed)
Port CXR performed 

## 2021-04-28 NOTE — ED Notes (Signed)
Pt provided water to encourage PO fluids ?

## 2021-04-28 NOTE — ED Provider Notes (Signed)
[facility] Provider Note    Event Date/Time   First MD Initiated Contact with Patient 04/28/21 [phone]     (approximate)   History   Shortness of Breath   HPI  Jaime Kidd is a 66 y.o. female with history of COPD with continued tobacco use, hypertension, hyperlipidemia, fibromyalgia, depression who presents to the emergency department with complaints of productive cough, shortness of breath, wheezing for the past several days.  States she had similar symptoms about a week ago.  No fevers.  Has had chest discomfort from coughing.  No lower extremity swelling or pain.  Does not wear oxygen at home.  Was not hypoxic with EMS.  Also states that she fell tonight because she felt very weak.  No dizziness, numbness, focal weakness.  Denies hitting her head or losing consciousness.  Not on blood thinners.  Denies any injury from her fall.   History provided by patient and EMS.    Past Medical History:  Diagnosis Date   Arthritis    Asthma    Choledocholithiasis    COPD (chronic obstructive pulmonary disease) (HCC)    Depression    Duodenitis 06/21/2016   Fibromyalgia    Gastritis    GERD (gastroesophageal reflux disease)    Headache    migraines   History of hiatal hernia    Hx of elevated lipids    Hypertension    Pre-diabetes     Past Surgical History:  Procedure Laterality Date   CESAREAN SECTION     CHOLECYSTECTOMY     COLONOSCOPY WITH PROPOFOL N/A 06/15/2017   Procedure: COLONOSCOPY WITH PROPOFOL;  Surgeon: Toledo, Boykin Nearing, MD;  Location: [facility] ENDOSCOPY;  Service: Gastroenterology;  Laterality: N/A;   ERCP  12/2012 and 2015   ERCP N/A 11/27/2014   Procedure: ENDOSCOPIC RETROGRADE CHOLANGIOPANCREATOGRAPHY (ERCP);  Surgeon: Wallace Cullens, MD;  Location: [facility] ENDOSCOPY;  Service: Gastroenterology;  Laterality: N/A;   ESOPHAGOGASTRODUODENOSCOPY (EGD) WITH PROPOFOL N/A 06/21/2016   Procedure: ESOPHAGOGASTRODUODENOSCOPY (EGD) WITH PROPOFOL;  Surgeon:  Christena Deem, MD;  Location: [facility] ENDOSCOPY;  Service: Endoscopy;  Laterality: N/A;   ESOPHAGOGASTRODUODENOSCOPY (EGD) WITH PROPOFOL N/A 06/15/2017   Procedure: ESOPHAGOGASTRODUODENOSCOPY (EGD) WITH PROPOFOL;  Surgeon: Toledo, Boykin Nearing, MD;  Location: [facility] ENDOSCOPY;  Service: Gastroenterology;  Laterality: N/A;    MEDICATIONS:  Prior to Admission medications   Medication Sig Start Date End Date Taking? Authorizing Provider  albuterol (PROVENTIL HFA;VENTOLIN HFA) 108 (90 BASE) MCG/ACT inhaler Inhale into the lungs every 6 (six) hours as needed for wheezing or shortness of breath.    [provider]  amitriptyline (ELAVIL) 50 MG tablet Take 50 mg by mouth at bedtime.    [provider]  atorvastatin (LIPITOR) 20 MG tablet Take 20 mg by mouth daily.    [provider]  azithromycin (ZITHROMAX Z-PAK) 250 MG tablet Take 2 tablets (500 mg) on  Day 1,  followed by 1 tablet (250 mg) once daily on Days 2 through 5. Patient not taking: Reported on 06/15/2017 09/13/16   Tommi Rumps, PA-C  DULoxetine (CYMBALTA) 60 MG capsule Take 60 mg by mouth daily.    [provider]  esomeprazole (NEXIUM) 40 MG capsule Take 40 mg by mouth daily at 12 noon.    [provider]  losartan-hydrochlorothiazide (HYZAAR) 100-25 MG tablet Take 1 tablet by mouth daily.    [provider]  methocarbamol (ROBAXIN) 750 MG tablet Take 750 mg by mouth 2 (two) times daily.  [provider]  metoprolol (LOPRESSOR) 50 MG tablet Take 50 mg by mouth 2 (two) times daily.     [provider]  ondansetron (ZOFRAN-ODT) 4 MG disintegrating tablet Take 4 mg by mouth every 8 (eight) hours as needed for nausea or vomiting.    [provider]  oxyCODONE-acetaminophen (PERCOCET) 5-325 MG tablet Take 1 tablet by mouth every 6 (six) hours as needed for severe pain. Patient not taking: Reported on 06/15/2017 09/13/16   Tommi Rumps, PA-C  sucralfate (CARAFATE)  1 g tablet Take 1 g by mouth 4 (four) times daily -  with meals and at bedtime.    [provider]    Physical Exam   Triage Vital Signs: ED Triage Vitals  Enc Vitals Group     BP 04/28/21 0319 113/62     Pulse Rate 04/28/21 0319 86     Resp 04/28/21 0319 (!) 26     Temp 04/28/21 0319 98.4 F (36.9 C)     Temp Source 04/28/21 0319 Oral     SpO2 04/28/21 0316 94 %     Weight 04/28/21 0322 145 lb (65.8 kg)     Height 04/28/21 0322 5' (1.524 m)     Head Circumference --      Peak Flow --      Pain Score 04/28/21 0321 2     Pain Loc --      Pain Edu? --      Excl. in GC? --     Most recent vital signs: Vitals:   04/28/21 0630 04/28/21 0700  BP: 109/88 128/69  Pulse: 82 77  Resp: (!) 21 (!) 22  Temp:    SpO2: 95% 90%    CONSTITUTIONAL: Alert and oriented and responds appropriately to questions. Well-appearing; well-nourished HEAD: Normocephalic, atraumatic EYES: Conjunctivae clear, pupils appear equal, sclera nonicteric ENT: normal nose; moist mucous membranes NECK: Supple, normal ROM, no midline spinal tenderness or step-off or deformity CARD: RRR; S1 and S2 appreciated; no murmurs, no clicks, no rubs, no gallops RESP: Normal chest excursion without splinting or tachypnea; breath sounds clear and equal bilaterally; no wheezes, no rhonchi, no rales, no hypoxia or respiratory distress, speaking full sentences, diminished at bases bilaterally ABD/GI: Normal bowel sounds; non-distended; soft, non-tender, no rebound, no guarding, no peritoneal signs BACK: The back appears normal, no midline spinal tenderness or step-off or deformity EXT: Normal ROM in all joints; no deformity noted, no edema; no cyanosis, extremities nontender to palpation, no calf tenderness or calf swelling SKIN: Normal color for age and race; warm; no rash on exposed skin NEURO: Moves all extremities equally, normal speech no facial asymmetry PSYCH: The patient's mood and manner are  appropriate.   ED Results / Procedures / Treatments   LABS: (all labs ordered are listed, but only abnormal results are displayed) Labs Reviewed  CBC WITH DIFFERENTIAL/PLATELET - Abnormal; Notable for the following components:      Result Value   WBC 12.0 (*)    RBC 3.83 (*)    Hemoglobin 10.2 (*)    HCT 32.6 (*)    Neutro Abs 8.7 (*)    Monocytes Absolute 1.7 (*)    All other components within normal limits  BASIC METABOLIC PANEL - Abnormal; Notable for the following components:   Sodium 131 (*)    Glucose, Bld 140 (*)    Creatinine, Ser 1.41 (*)    Calcium 8.8 (*)    GFR, Estimated 41 (*)    All  other components within normal limits  URINALYSIS, ROUTINE W REFLEX MICROSCOPIC - Abnormal; Notable for the following components:   Color, Urine YELLOW (*)    APPearance HAZY (*)    Hgb urine dipstick SMALL (*)    Protein, ur 30 (*)    All other components within normal limits  RESP PANEL BY RT-PCR (FLU A&B, COVID) ARPGX2  CULTURE, BLOOD (ROUTINE X 2)  CULTURE, BLOOD (ROUTINE X 2)  BRAIN NATRIURETIC PEPTIDE  MAGNESIUM  PROCALCITONIN  LACTIC ACID, PLASMA  TROPONIN I (HIGH SENSITIVITY)  TROPONIN I (HIGH SENSITIVITY)     EKG:  EKG Interpretation  Date/Time:  Tuesday April 28 2021 03:50:51 EDT Ventricular Rate:  80 PR Interval:  167 QRS Duration: 74 QT Interval:  489 QTC Calculation: 565 R Axis:   45 Text Interpretation: Sinus rhythm Abnormal R-wave progression, early transition Borderline T abnormalities, anterior leads Prolonged QT interval Confirmed by Rochele Raring [phone]) on 04/28/2021 3:54:20 AM         EKG Interpretation  Date/Time:  Tuesday April 28 2021 03:57:46 EDT Ventricular Rate:  81 PR Interval:  168 QRS Duration: 66 QT Interval:  417 QTC Calculation: 485 R Axis:   51 Text Interpretation: Sinus rhythm Abnormal R-wave progression, early transition Borderline T abnormalities, anterior leads Confirmed by Rochele Raring [phone]) on 04/28/2021 4:00:00 AM         RADIOLOGY: My personal review and interpretation of imaging: Chest x-ray shows no infiltrate, edema or pneumothorax.  I have personally reviewed all radiology reports.   DG Chest Port 1 View  Result Date: 04/28/2021 CLINICAL DATA:  66 year old female with shortness of breath, cough, low-grade fever. Smoker. Test for COVID-19 is pending. EXAM: PORTABLE CHEST 1 VIEW COMPARISON:  Chest radiographs 10/17/2018 and earlier. FINDINGS: Portable AP upright view at 0406 hours. Stable lung volumes and mediastinal contours, within normal limits. Visualized tracheal air column is within normal limits. Allowing for portable technique the lungs are clear. No pneumothorax or pleural effusion. No acute osseous abnormality identified. Paucity of bowel gas in the upper abdomen. Stable cholecystectomy clips. IMPRESSION: No acute cardiopulmonary abnormality. Electronically Signed   By: Odessa Fleming M.D.   On: 04/28/2021 04:35     PROCEDURES:  Critical Care performed: Yes, see critical care procedure note(s)   CRITICAL CARE Performed by: Baxter Hire Shawne Eskelson   Total critical care time: 45 minutes  Critical care time was exclusive of separately billable procedures and treating other patients.  Critical care was necessary to treat or prevent imminent or life-threatening deterioration.  Critical care was time spent personally by me on the following activities: development of treatment plan with patient and/or surrogate as well as nursing, discussions with consultants, evaluation of patient's response to treatment, examination of patient, obtaining history from patient or surrogate, ordering and performing treatments and interventions, ordering and review of laboratory studies, ordering and review of radiographic studies, pulse oximetry and re-evaluation of patient's condition.   Marland Kitchen1-3 Lead EKG Interpretation Performed by: Claryssa Sandner, Layla Maw, DO Authorized by: Jaice Lague, Layla Maw, DO     Interpretation: normal     ECG  rate:  86   ECG rate assessment: normal     Rhythm: sinus rhythm     Ectopy: none     Conduction: normal      IMPRESSION / MDM / ASSESSMENT AND PLAN / ED COURSE  I reviewed the triage vital signs and the nursing notes.    Patient here with shortness of breath, atypical chest pain.  History of COPD  with continued tobacco use.  The patient is on the cardiac monitor to evaluate for evidence of arrhythmia and/or significant heart rate changes.   DIFFERENTIAL DIAGNOSIS (includes but not limited to):   COPD exacerbation, viral URI, pneumonia, less likely ACS, PE, dissection, CHF   PLAN: We will obtain CBC, BMP, troponin x2, chest x-ray, urinalysis, COVID and flu swabs.  Will give IV fluids, DuoNeb, Solu-Medrol.   MEDICATIONS GIVEN IN ED: Medications  azithromycin (ZITHROMAX) 500 mg in sodium chloride 0.9 % 250 mL IVPB (has no administration in time range)  sodium chloride 0.9 % bolus 1,000 mL (has no administration in time range)  ipratropium-albuterol (DUONEB) 0.5-2.5 (3) MG/3ML nebulizer solution 3 mL (has no administration in time range)  ipratropium-albuterol (DUONEB) 0.5-2.5 (3) MG/3ML nebulizer solution 3 mL (3 mLs Nebulization Given 04/28/21 0404)  sodium chloride 0.9 % bolus 500 mL (0 mLs Intravenous Stopped 04/28/21 0526)  methylPREDNISolone sodium succinate (SOLU-MEDROL) 125 mg/2 mL injection 125 mg (125 mg Intravenous Given 04/28/21 0431)  sodium chloride 0.9 % bolus 500 mL (0 mLs Intravenous Stopped 04/28/21 1914)     ED COURSE: Patient reports that she is feeling better and feels like she is breathing better.  She has not had any hypoxia here.  She was able to ambulate without her sats dropping but did feel little bit unsteady on her feet.  She does have a walker for home.  Her labs show leukocytosis of 12,000 but she is afebrile.  I did add on a procalcitonin that is 0.19.  Chest x-ray reviewed by myself and radiologist and shows no infiltrate, edema or pneumothorax.  Her  creatinine is slightly elevated at 1.4.  Was 1.1 a month ago.  She is getting IV fluids.  First troponin is negative.  EKG is nonischemic.  COVID and flu swabs negative.  Urine shows no sign of infection, ketones.  Her blood pressures have been running slightly on the lower than normal side but have been fluid responsive.  Given the change in her sputum production, we will start her on azithromycin given her history of COPD.  We will continue to monitor blood pressure closely.   Patient's blood pressures continue to run on the low side.  Have had further discussion with patient.  Will admit for IV hydration and monitoring of her blood pressure and mild AKI.  Patient and son are comfortable with this plan.   On my reevaluation blood pressure is now 120s/80s but oxygen saturation is 90% on room air at rest.  We will continue to monitor this closely.  Will give another breathing treatment.    CONSULTS:  Consulted and discussed patient's case with hospitalist, Dr. Clyde Lundborg.  I have recommended admission and consulting physician agrees and will place admission orders.  Patient (and family if present) agree with this plan.   I reviewed all nursing notes, vitals, pertinent previous records.  All labs, EKGs, imaging ordered have been independently reviewed and interpreted by myself.    OUTSIDE RECORDS REVIEWED: Reviewed patient's last office visit with Jessnie Jose-Matthews on 03/23/2021.         FINAL CLINICAL IMPRESSION(S) / ED DIAGNOSES   Final diagnoses:  Shortness of breath  Atypical chest pain  COPD exacerbation (HCC)  AKI (acute kidney injury) (HCC)  Hypotension, unspecified hypotension type     Rx / DC Orders   ED Discharge Orders          Ordered    albuterol (VENTOLIN HFA) 108 (90 Base) MCG/ACT inhaler  Every 4 hours PRN        04/28/21 0632    predniSONE (DELTASONE) 10 MG tablet  Daily        04/28/21 0632    azithromycin (ZITHROMAX Z-PAK) 250 MG tablet        04/28/21 0272              Note:  This document was prepared using Dragon voice recognition software and may include unintentional dictation errors.   Maurie Musco, Layla Maw, DO 04/28/21 [phone]

## 2021-04-28 NOTE — ED Notes (Signed)
Pt ambulated to the restroom with assistance and without oxygen. O2 sats maintained at 93-94%. However, pt reports dizziness and is unsteady on her feet. MD notified ?

## 2021-04-28 NOTE — ED Notes (Signed)
Joana, RN in with pt to attempt new IV start. ?

## 2021-04-28 NOTE — Discharge Instructions (Addendum)
You may take Tylenol 1000 mg every 6 hours as needed for fever and pain. ? ?Please take your antibiotics and steroids until complete. ? ?Your creatinine (kidney function) was slightly elevated today.  We have given you IV fluids for this but recommend that you avoid NSAIDs such as ibuprofen, aspirin, Aleve, Goody powders and increase your water intake at home and follow-up with your primary care doctor in 1 to 2 weeks to have this rechecked. ? ?Your blood pressure was also running on the slightly low side.  I recommend that you hold your metoprolol and Hyzaar which are your blood pressure medications for the next 3 days. ?

## 2021-04-29 DIAGNOSIS — N1831 Chronic kidney disease, stage 3a: Secondary | ICD-10-CM | POA: Diagnosis present

## 2021-04-29 DIAGNOSIS — J441 Chronic obstructive pulmonary disease with (acute) exacerbation: Secondary | ICD-10-CM | POA: Diagnosis present

## 2021-04-29 DIAGNOSIS — I129 Hypertensive chronic kidney disease with stage 1 through stage 4 chronic kidney disease, or unspecified chronic kidney disease: Secondary | ICD-10-CM | POA: Diagnosis present

## 2021-04-29 DIAGNOSIS — E785 Hyperlipidemia, unspecified: Secondary | ICD-10-CM | POA: Diagnosis present

## 2021-04-29 DIAGNOSIS — Z885 Allergy status to narcotic agent status: Secondary | ICD-10-CM | POA: Diagnosis not present

## 2021-04-29 DIAGNOSIS — Z20822 Contact with and (suspected) exposure to covid-19: Secondary | ICD-10-CM | POA: Diagnosis present

## 2021-04-29 DIAGNOSIS — R7303 Prediabetes: Secondary | ICD-10-CM | POA: Diagnosis present

## 2021-04-29 DIAGNOSIS — I959 Hypotension, unspecified: Secondary | ICD-10-CM | POA: Diagnosis present

## 2021-04-29 DIAGNOSIS — Z888 Allergy status to other drugs, medicaments and biological substances status: Secondary | ICD-10-CM | POA: Diagnosis not present

## 2021-04-29 DIAGNOSIS — M1990 Unspecified osteoarthritis, unspecified site: Secondary | ICD-10-CM | POA: Diagnosis present

## 2021-04-29 DIAGNOSIS — Z841 Family history of disorders of kidney and ureter: Secondary | ICD-10-CM | POA: Diagnosis not present

## 2021-04-29 DIAGNOSIS — F17210 Nicotine dependence, cigarettes, uncomplicated: Secondary | ICD-10-CM | POA: Diagnosis present

## 2021-04-29 DIAGNOSIS — Z7952 Long term (current) use of systemic steroids: Secondary | ICD-10-CM | POA: Diagnosis not present

## 2021-04-29 DIAGNOSIS — M797 Fibromyalgia: Secondary | ICD-10-CM | POA: Diagnosis present

## 2021-04-29 DIAGNOSIS — R0602 Shortness of breath: Secondary | ICD-10-CM | POA: Diagnosis present

## 2021-04-29 DIAGNOSIS — Z79899 Other long term (current) drug therapy: Secondary | ICD-10-CM | POA: Diagnosis not present

## 2021-04-29 DIAGNOSIS — Z5320 Procedure and treatment not carried out because of patient's decision for unspecified reasons: Secondary | ICD-10-CM | POA: Diagnosis not present

## 2021-04-29 DIAGNOSIS — G43909 Migraine, unspecified, not intractable, without status migrainosus: Secondary | ICD-10-CM | POA: Diagnosis present

## 2021-04-29 DIAGNOSIS — Y92009 Unspecified place in unspecified non-institutional (private) residence as the place of occurrence of the external cause: Secondary | ICD-10-CM | POA: Diagnosis not present

## 2021-04-29 DIAGNOSIS — F32A Depression, unspecified: Secondary | ICD-10-CM | POA: Diagnosis present

## 2021-04-29 DIAGNOSIS — K2970 Gastritis, unspecified, without bleeding: Secondary | ICD-10-CM | POA: Diagnosis present

## 2021-04-29 DIAGNOSIS — K219 Gastro-esophageal reflux disease without esophagitis: Secondary | ICD-10-CM | POA: Diagnosis present

## 2021-04-29 DIAGNOSIS — Z881 Allergy status to other antibiotic agents status: Secondary | ICD-10-CM | POA: Diagnosis not present

## 2021-04-29 DIAGNOSIS — Z882 Allergy status to sulfonamides status: Secondary | ICD-10-CM | POA: Diagnosis not present

## 2021-04-29 DIAGNOSIS — W010XXA Fall on same level from slipping, tripping and stumbling without subsequent striking against object, initial encounter: Secondary | ICD-10-CM | POA: Diagnosis present

## 2021-04-29 DIAGNOSIS — N179 Acute kidney failure, unspecified: Secondary | ICD-10-CM | POA: Diagnosis present

## 2021-04-29 LAB — BASIC METABOLIC PANEL
Anion gap: 7 (ref 5–15)
BUN: 12 mg/dL (ref 8–23)
CO2: 22 mmol/L (ref 22–32)
Calcium: 8.5 mg/dL — ABNORMAL LOW (ref 8.9–10.3)
Chloride: 110 mmol/L (ref 98–111)
Creatinine, Ser: 0.95 mg/dL (ref 0.44–1.00)
GFR, Estimated: >60 mL/min (ref >60)
Glucose, Bld: 165 mg/dL — ABNORMAL HIGH (ref 70–99)
Potassium: 3.9 mmol/L (ref 3.5–5.1)
Sodium: 139 mmol/L (ref 135–145)

## 2021-04-29 LAB — HIV ANTIBODY (ROUTINE TESTING W REFLEX): HIV Screen 4th Generation wRfx: NONREACTIVE

## 2021-04-29 LAB — CBC
HCT: 27.9 % — ABNORMAL LOW (ref 36.0–46.0)
Hemoglobin: 8.7 g/dL — ABNORMAL LOW (ref 12.0–15.0)
MCH: 26.4 pg (ref 26.0–34.0)
MCHC: 31.2 g/dL (ref 30.0–36.0)
MCV: 84.8 fL (ref 80.0–100.0)
Platelets: 288 10*3/uL (ref 150–400)
RBC: 3.29 MIL/uL — ABNORMAL LOW (ref 3.87–5.11)
RDW: 14.9 % (ref 11.5–15.5)
WBC: 9.6 10*3/uL (ref 4.0–10.5)
nRBC: 0 % (ref 0.0–0.2)

## 2021-04-29 MED ORDER — METOPROLOL TARTRATE 50 MG PO TABS
50.0000 mg | ORAL_TABLET | Freq: Two times a day (BID) | ORAL | Status: DC
  Administered 2021-04-29 – 2021-04-30 (×2): 50 mg via ORAL
  Filled 2021-04-29 (×2): qty 1

## 2021-04-29 MED ORDER — PREDNISONE 20 MG PO TABS
40.0000 mg | ORAL_TABLET | Freq: Every day | ORAL | Status: DC
  Administered 2021-04-29: 40 mg via ORAL
  Filled 2021-04-29: qty 2

## 2021-04-29 MED ORDER — IPRATROPIUM-ALBUTEROL 0.5-2.5 (3) MG/3ML IN SOLN
3.0000 mL | Freq: Three times a day (TID) | RESPIRATORY_TRACT | Status: DC
  Administered 2021-04-29: 3 mL via RESPIRATORY_TRACT
  Filled 2021-04-29: qty 3

## 2021-04-29 MED ORDER — METHYLPREDNISOLONE SODIUM SUCC 40 MG IJ SOLR
40.0000 mg | Freq: Two times a day (BID) | INTRAMUSCULAR | Status: DC
  Administered 2021-04-29 – 2021-04-30 (×2): 40 mg via INTRAVENOUS
  Filled 2021-04-29 (×2): qty 1

## 2021-04-29 MED ORDER — POLYVINYL ALCOHOL 1.4 % OP SOLN
1.0000 [drp] | OPHTHALMIC | Status: DC | PRN
  Administered 2021-04-29: 1 [drp] via OPHTHALMIC
  Filled 2021-04-29 (×2): qty 15

## 2021-04-29 MED ORDER — IPRATROPIUM-ALBUTEROL 0.5-2.5 (3) MG/3ML IN SOLN
3.0000 mL | Freq: Four times a day (QID) | RESPIRATORY_TRACT | Status: DC
  Administered 2021-04-29 – 2021-04-30 (×3): 3 mL via RESPIRATORY_TRACT
  Filled 2021-04-29 (×3): qty 3

## 2021-04-29 NOTE — Progress Notes (Signed)
PT Cancellation Note ? ?Patient Details ?Name: Jaime Kidd ?MRN: [id number] ?DOB: 1955-06-20 ? ? ?Cancelled Treatment:    Reason Eval/Treat Not Completed: Other (comment) Patient reports she is feeling dizzy and shaky this morning. States maybe later. Will return later as time allows.   ? ? ?Carnell Beavers ?04/29/2021, 11:18 AM ?

## 2021-04-29 NOTE — Progress Notes (Signed)
?PROGRESS NOTE ? ? ? ?Jaime Kidd  HEN:[id number] DOB: Jan 29, 1956 DOA: 04/28/2021 ?PCP: [facility]  ?124A/124A-AA ? ? ?Assessment & Plan: ?  ?Principal Problem: ?  COPD exacerbation (Jaime Kidd) ?Active Problems: ?  Fall ?  HTN (hypertension) ?  HLD (hyperlipidemia) ?  Depression ?  GERD (gastroesophageal reflux disease) ?  Tobacco abuse ?  Acute renal failure superimposed on stage 3a chronic kidney disease (Jaime Kidd) ?  COPD with acute exacerbation (Jaime Kidd) ? ? ?Jaime Kidd is a 66 y.o. female with medical history significant of COPD not on oxygen normally, hypertension, hyperlipidemia, asthma, GERD, depression, duodenitis, fibromyalgia, migraine headache, tobacco abuse, CKD-3A, who presents with shortness breath. ?  ?COPD exacerbation ([place]):  ?Patient has productive cough, shortness breath, wheezing, negative chest x-ray for infiltration, clinically consistent with COPD exacerbation.  Pt was an active smoker. ?--RVP neg. ?Plan: ?--cont IV solumedrol ?--DuoNeb QID scheduled ?--cont azithromycin ?  ?Fall: Seems to be a mechanical fall.  Patient refused CT scanning of head and neck.  No focal neuro deficit ?--PT rec home with HH ?  ?HTN (hypertension):  ?Blood pressure intermittently soft. ?-IV hydralazine as needed ?--hold home hyzaar  ?--resume home metop ?  ?HLD (hyperlipidemia) ?-Lipitor ?  ?Depression ?-Amitriptyline ?  ?GERD (gastroesophageal reflux disease) ?-Protonix ?  ?Tobacco abuse ?-Nicotine patch ?  ?Acute renal failure superimposed on stage 3a chronic kidney disease (Jaime Kidd):  ?Creatinine 1.1 on 03/23/2021.  Her creatinine is 1.41, BUN 12. ?-Hold Hyzaar ?--d/c MIVF and encourage oral hydration ?  ? ?DVT prophylaxis: Lovenox SQ ?Code Status: Full code  ?Family Communication:  ?Level of care: Med-Surg ?Dispo:   ?The patient is from: home ?Anticipated d/c is to: home ?Anticipated d/c date is: 1-2 days ?Patient currently is not medically ready to d/c due to: significant dyspnea ? ? ?Subjective and  Interval History:  ?Pt reported breathing was not better, felt like airway was closing off.  Also complained of feeling jittery from the steroid. ? ? ?Objective: ?Vitals:  ? 04/29/21 0554 04/29/21 0735 04/29/21 1409 04/29/21 1523  ?BP: 140/69 (!) 142/75  131/74  ?Pulse: 96 98  (!) 114  ?Resp: '15 16  18  '$ ?Temp: (!) 97.4 ?F (36.3 ?C) (!) 97.5 ?F (36.4 ?C)  97.8 ?F (36.6 ?C)  ?TempSrc: Oral   Oral  ?SpO2: 93% 92% 95% 96%  ?Weight:      ?Height:      ? ? ?Intake/Output Summary (Last 24 hours) at 04/29/2021 1607 ?Last data filed at 04/29/2021 1416 ?Gross per 24 hour  ?Intake 1512.35 ml  ?Output 400 ml  ?Net 1112.35 ml  ? ?Filed Weights  ? 04/28/21 0322  ?Weight: 65.8 kg  ? ? ?Examination:  ? ?Constitutional: NAD, AAOx3 ?HEENT: conjunctivae and lids normal, EOMI ?CV: No cyanosis.   ?RESP: reduced air movement, loud wheezes, on RA ?Extremities: No effusions, edema in BLE ?SKIN: warm, dry ?Neuro: II - XII grossly intact.   ?Psych: Normal mood and affect.  Appropriate judgement and reason ? ? ?Data Reviewed: I have personally reviewed following labs and imaging studies ? ?CBC: ?Recent Labs  ?Lab 04/28/21 ?0352 04/29/21 ?0503  ?WBC 12.0* 9.6  ?NEUTROABS 8.7*  --   ?HGB 10.2* 8.7*  ?HCT 32.6* 27.9*  ?MCV 85.1 84.8  ?PLT 315 288  ? ?Basic Metabolic Panel: ?Recent Labs  ?Lab 04/28/21 ?0352 04/28/21 ?0403 04/29/21 ?0503  ?NA 131*  --  139  ?K 4.0  --  3.9  ?CL 99  --  110  ?  CO2 24  --  22  ?GLUCOSE 140*  --  165*  ?BUN 12  --  12  ?CREATININE 1.41*  --  0.95  ?CALCIUM 8.8*  --  8.5*  ?MG  --  2.1  --   ? ?GFR: ?Estimated Creatinine Clearance: 48.6 mL/min (by C-G formula based on SCr of 0.95 mg/dL). ?Liver Function Tests: ?No results for input(s): AST, ALT, ALKPHOS, BILITOT, PROT, ALBUMIN in the last 168 hours. ?No results for input(s): LIPASE, AMYLASE in the last 168 hours. ?No results for input(s): AMMONIA in the last 168 hours. ?Coagulation Profile: ?No results for input(s): INR, PROTIME in the last 168 hours. ?Cardiac  Enzymes: ?No results for input(s): CKTOTAL, CKMB, CKMBINDEX, TROPONINI in the last 168 hours. ?BNP (last 3 results) ?No results for input(s): PROBNP in the last 8760 hours. ?HbA1C: ?No results for input(s): HGBA1C in the last 72 hours. ?CBG: ?No results for input(s): GLUCAP in the last 168 hours. ?Lipid Profile: ?No results for input(s): CHOL, HDL, LDLCALC, TRIG, CHOLHDL, LDLDIRECT in the last 72 hours. ?Thyroid Function Tests: ?No results for input(s): TSH, T4TOTAL, FREET4, T3FREE, THYROIDAB in the last 72 hours. ?Anemia Panel: ?No results for input(s): VITAMINB12, FOLATE, FERRITIN, TIBC, IRON, RETICCTPCT in the last 72 hours. ?Sepsis Labs: ?Recent Labs  ?Lab 04/28/21 ?0814 04/28/21 ?4818  ?PROCALCITON 0.19  --   ?LATICACIDVEN  --  0.6  ? ? ?Recent Results (from the past 240 hour(s))  ?Resp Panel by RT-PCR (Flu A&B, Covid) Nasopharyngeal Swab     Status: None  ? Collection Time: 04/28/21  3:52 AM  ? Specimen: Nasopharyngeal Swab; Nasopharyngeal(NP) swabs in vial transport medium  ?Result Value Ref Range Status  ? SARS Coronavirus 2 by RT PCR NEGATIVE NEGATIVE Final  ?  Comment: (NOTE) ?SARS-CoV-2 target nucleic acids are NOT DETECTED. ? ?The SARS-CoV-2 RNA is generally detectable in upper respiratory ?specimens during the acute phase of infection. The lowest ?concentration of SARS-CoV-2 viral copies this assay can detect is ?138 copies/mL. A negative result does not preclude SARS-Cov-2 ?infection and should not be used as the sole basis for treatment or ?other patient management decisions. A negative result may occur with  ?improper specimen collection/handling, submission of specimen other ?than nasopharyngeal swab, presence of viral mutation(s) within the ?areas targeted by this assay, and inadequate number of viral ?copies(<138 copies/mL). A negative result must be combined with ?clinical observations, patient history, and epidemiological ?information. The expected result is Negative. ? ?Fact Sheet for Patients:   ?EntrepreneurPulse.com.au ? ?Fact Sheet for Healthcare Providers:  ?IncredibleEmployment.be ? ?This test is no t yet approved or cleared by the [place] FDA and  ?has been authorized for detection and/or diagnosis of SARS-CoV-2 by ?FDA under an Emergency Use Authorization (EUA). This EUA will remain  ?in effect (meaning this test can be used) for the duration of the ?COVID-19 declaration under Section 564(b)(1) of the Act, 21 ?U.S.C.section 360bbb-3(b)(1), unless the authorization is terminated  ?or revoked sooner.  ? ? ?  ? Influenza A by PCR NEGATIVE NEGATIVE Final  ? Influenza B by PCR NEGATIVE NEGATIVE Final  ?  Comment: (NOTE) ?The Xpert Xpress SARS-CoV-2/FLU/RSV plus assay is intended as an aid ?in the diagnosis of influenza from Nasopharyngeal swab specimens and ?should not be used as a sole basis for treatment. Nasal washings and ?aspirates are unacceptable for Xpert Xpress SARS-CoV-2/FLU/RSV ?testing. ? ?Fact Sheet for Patients: ?EntrepreneurPulse.com.au ? ?Fact Sheet for Healthcare Providers: ?IncredibleEmployment.be ? ?This test is not yet approved or cleared by  the [employer] and ?has been authorized for detection and/or diagnosis of SARS-CoV-2 by ?FDA under an Emergency Use Authorization (EUA). This EUA will remain ?in effect (meaning this test can be used) for the duration of the ?COVID-19 declaration under Section 564(b)(1) of the Act, 21 U.S.C. ?section 360bbb-3(b)(1), unless the authorization is terminated or ?revoked. ? ?Performed at [facility], [place], ?[place] [address] ?  ?Respiratory (~20 pathogens) panel by PCR     Status: None  ? Collection Time: 04/28/21  3:52 AM  ? Specimen: Nasopharyngeal Swab; Respiratory  ?Result Value Ref Range Status  ? Adenovirus NOT DETECTED NOT DETECTED Final  ? Coronavirus 229E NOT DETECTED NOT DETECTED Final  ?  Comment: (NOTE) ?The Coronavirus on the  Respiratory Panel, DOES NOT test for the novel  ?Coronavirus (2019 nCoV) ?  ? Coronavirus HKU1 NOT DETECTED NOT DETECTED Final  ? Coronavirus NL63 NOT DETECTED NOT DETECTED Final  ? Coronavirus OC43 NOT DETECTED NOT D

## 2021-04-29 NOTE — Progress Notes (Signed)
Physical Therapy Treatment ?Patient Details ?Name: Jaime Kidd ?MRN: [id number] ?DOB: 14-Nov-1955 ?Today's Date: 04/29/2021 ? ? ?History of Present Illness Jaime Kidd is a 66 y.o. female with medical history significant of COPD not on oxygen normally, hypertension, hyperlipidemia, asthma, GERD, depression, duodenitis, fibromyalgia, migraine headache, tobacco abuse, CKD-3A, who presents with shortness breath. ? ?  ?PT Comments  ? ? Patient received in bed, she reports she is feeling better now, agrees to PT session. Patient is mod independent with bed mobility. Transfers with supervision. She is able to ambulate 30 feet in room with RW. Fatigued with this and increased HR to 124. Patient performed seated LE strengthening exercises in recliner. She will continue to benefit from skilled PT while here to improve strength, endurance and safety. ?    ?Recommendations for follow up therapy are one component of a multi-disciplinary discharge planning process, led by the attending physician.  Recommendations may be updated based on patient status, additional functional criteria and insurance authorization. ? ?Follow Up Recommendations ? Home health PT ?  ?  ?Assistance Recommended at Discharge Intermittent Supervision/Assistance  ?Patient can return home with the following Assistance with cooking/housework;Assist for transportation;Help with stairs or ramp for entrance ?  ?Equipment Recommendations ? None recommended by PT  ?  ?Recommendations for Other Services   ? ? ?  ?Precautions / Restrictions Precautions ?Precautions: Fall ?Restrictions ?Weight Bearing Restrictions: No  ?  ? ?Mobility ? Bed Mobility ?Overal bed mobility: Independent ?  ?  ?  ?  ?  ?  ?  ?  ? ?Transfers ?Overall transfer level: Needs assistance ?Equipment used: Rolling walker (2 wheels) ?Transfers: Sit to/from Stand ?Sit to Stand: Supervision ?  ?  ?  ?  ?  ?  ?  ? ?Ambulation/Gait ?Ambulation/Gait assistance: Min guard ?Gait Distance (Feet): 30  Feet ?Assistive device: Rolling walker (2 wheels) ?Gait Pattern/deviations: Step-through pattern, Decreased step length - right, Decreased step length - left ?Gait velocity: decr ?  ?  ?General Gait Details: patient winded with ambulation, O2 sats in mid 90s. HR up to 124. Patient fatigued with room ambulation. ? ? ?Stairs ?  ?  ?  ?  ?  ? ? ?Wheelchair Mobility ?  ? ?Modified Rankin (Stroke Patients Only) ?  ? ? ?  ?Balance Overall balance assessment: Modified Independent ?Sitting-balance support: Feet supported ?Sitting balance-Leahy Scale: Good ?  ?  ?Standing balance support: Bilateral upper extremity supported, During functional activity, Reliant on assistive device for balance ?Standing balance-Leahy Scale: Fair ?  ?  ?  ?  ?  ?  ?  ?  ?  ?  ?  ?  ?  ? ?  ?Cognition Arousal/Alertness: Awake/alert ?Behavior During Therapy: [facility] for tasks assessed/performed ?Overall Cognitive Status: Within Functional Limits for tasks assessed ?  ?  ?  ?  ?  ?  ?  ?  ?  ?  ?  ?  ?  ?  ?  ?  ?  ?  ?  ? ?  ?Exercises Other Exercises ?Other Exercises: Seated LAQ, SLR, hip abd/add x 10 reps each bilaterally ? ?  ?General Comments   ?  ?  ? ?Pertinent Vitals/Pain Pain Assessment ?Pain Assessment: No/denies pain  ? ? ?Home Living   ?  ?  ?  ?  ?  ?  ?  ?  ?  ?   ?  ?Prior Function    ?  ?  ?   ? ?  PT Goals (current goals can now be found in the care plan section) Acute Rehab PT Goals ?Patient Stated Goal: improve strength and prevent falls at home to maintain independence ?PT Goal Formulation: With patient ?Time For Goal Achievement: 05/12/21 ?Potential to Achieve Goals: Good ?Progress towards PT goals: Progressing toward goals ? ?  ?Frequency ? ? ? Min 2X/week ? ? ? ?  ?PT Plan Current plan remains appropriate  ? ? ?Co-evaluation   ?  ?  ?  ?  ? ?  ?AM-PAC PT "6 Clicks" Mobility   ?Outcome Measure ? Help needed turning from your back to your side while in a flat bed without using bedrails?: None ?Help needed moving from lying on your  back to sitting on the side of a flat bed without using bedrails?: None ?Help needed moving to and from a bed to a chair (including a wheelchair)?: A Little ?Help needed standing up from a chair using your arms (e.g., wheelchair or bedside chair)?: A Little ?Help needed to walk in hospital room?: A Little ?Help needed climbing 3-5 steps with a railing? : A Lot ?6 Click Score: 19 ? ?  ?End of Session Equipment Utilized During Treatment: Gait belt ?Activity Tolerance: Patient limited by fatigue ?Patient left: in chair;with call bell/phone within reach ?Nurse Communication: Mobility status (patient has brief on in recliner, not pure wick) ?PT Visit Diagnosis: Muscle weakness (generalized) (M62.81);Difficulty in walking, not elsewhere classified (R26.2);Unsteadiness on feet (R26.81) ?  ? ? ?Time: 1340-1403 ?PT Time Calculation (min) (ACUTE ONLY): 23 min ? ?Charges:  $Gait Training: 8-22 mins ?$Therapeutic Exercise: 8-22 mins          ?          ? ?Amanda Cockayne, PT, GCS ?04/29/21,2:19 PM ? ?

## 2021-04-30 LAB — BASIC METABOLIC PANEL
Anion gap: 8 (ref 5–15)
BUN: 14 mg/dL (ref 8–23)
CO2: 24 mmol/L (ref 22–32)
Calcium: 9.1 mg/dL (ref 8.9–10.3)
Chloride: 108 mmol/L (ref 98–111)
Creatinine, Ser: 0.97 mg/dL (ref 0.44–1.00)
GFR, Estimated: >60 mL/min (ref >60)
Glucose, Bld: 131 mg/dL — ABNORMAL HIGH (ref 70–99)
Potassium: 4.8 mmol/L (ref 3.5–5.1)
Sodium: 140 mmol/L (ref 135–145)

## 2021-04-30 LAB — CBC
HCT: 30.8 % — ABNORMAL LOW (ref 36.0–46.0)
Hemoglobin: 9.4 g/dL — ABNORMAL LOW (ref 12.0–15.0)
MCH: 26.5 pg (ref 26.0–34.0)
MCHC: 30.5 g/dL (ref 30.0–36.0)
MCV: 86.8 fL (ref 80.0–100.0)
Platelets: 358 10*3/uL (ref 150–400)
RBC: 3.55 MIL/uL — ABNORMAL LOW (ref 3.87–5.11)
RDW: 15.2 % (ref 11.5–15.5)
WBC: 13.7 10*3/uL — ABNORMAL HIGH (ref 4.0–10.5)
nRBC: 0 % (ref 0.0–0.2)

## 2021-04-30 LAB — MAGNESIUM: Magnesium: 2.3 mg/dL (ref 1.7–2.4)

## 2021-04-30 MED ORDER — PREDNISONE 20 MG PO TABS
40.0000 mg | ORAL_TABLET | Freq: Every day | ORAL | Status: DC
  Administered 2021-04-30: 40 mg via ORAL
  Filled 2021-04-30: qty 2

## 2021-04-30 MED ORDER — IPRATROPIUM-ALBUTEROL 0.5-2.5 (3) MG/3ML IN SOLN: 3.0000 mL | Freq: Three times a day (TID) | RESPIRATORY_TRACT | Status: DC

## 2021-04-30 MED ORDER — LOSARTAN POTASSIUM 50 MG PO TABS
50.0000 mg | ORAL_TABLET | Freq: Every day | ORAL | Status: DC
  Administered 2021-04-30: 50 mg via ORAL
  Filled 2021-04-30: qty 1

## 2021-04-30 MED ORDER — DM-GUAIFENESIN ER 30-600 MG PO TB12: 1.0000 | ORAL_TABLET | Freq: Two times a day (BID) | ORAL | Status: AC | PRN

## 2021-04-30 MED ORDER — IPRATROPIUM-ALBUTEROL 0.5-2.5 (3) MG/3ML IN SOLN: 3.0000 mL | Freq: Four times a day (QID) | RESPIRATORY_TRACT | 2 refills | Status: AC | PRN

## 2021-04-30 MED ORDER — PREDNISONE 20 MG PO TABS: 40.0000 mg | ORAL_TABLET | Freq: Every day | ORAL | 0 refills | Status: AC

## 2021-04-30 MED ORDER — NICOTINE 21 MG/24HR TD PT24: 21.0000 mg | MEDICATED_PATCH | Freq: Every day | TRANSDERMAL | 0 refills | Status: AC

## 2021-04-30 MED ORDER — AZITHROMYCIN 250 MG PO TABS: 250.0000 mg | ORAL_TABLET | Freq: Every day | ORAL | 0 refills | Status: AC

## 2021-04-30 NOTE — Progress Notes (Signed)
Received MD order to discharge patient to home, reviewed home meds, discharge instructions, prescriptions and follow up appointments with patient and patient verbalized understanding    

## 2021-04-30 NOTE — Discharge Summary (Signed)
? ?Physician Discharge Summary ? ? ?Jaime Kidd  female DOB: [birth year]  ?ZOX:[id number] ? ?PCP: [facility] ? ?Admit date: 04/28/2021 ?Discharge date: 04/30/2021 ? ?Admitted From: home ?Disposition:  home ?Home Health: Yes ?CODE STATUS: Full code ? ?Discharge Instructions   ? ? Discharge instructions   Complete by: As directed ?  ? You have been treated for COPD flare up.  Please finish 4 more days of prednisone and 2 more days of azithromycin at home starting tomorrow 3/17.   ? ?Use DuoNeb breathing treatment 3-4 times per day for the first week, then after that, as needed. ? ? ?Dr. Enzo Bi ?- ?-  ? ?  ? ? ? ?Hospital Course:  ?For full details, please see H&P, progress notes, consult notes and ancillary notes.  ?Briefly,  ?Jaime Kidd is a 66 y.o. female with medical history significant of COPD not on oxygen normally, hypertension, asthma, depression, duodenitis, fibromyalgia, migraine headache, tobacco abuse, CKD-3A, who presented with shortness breath. ?  ?COPD exacerbation ([place]):  ?Patient has productive cough, shortness breath, wheezing, chest x-ray neg for infiltration, clinically consistent with COPD exacerbation.  Pt did not need supplemental O2.  Pt was an active smoker. ?--RVP neg. ?--received IV solumedrol and transitioned to prednisone 40 mg daily.  Pt was discharged on 4 more days of prednisone, and 2 more days of azithromycin.   ?--DuoNeb QID scheduled while inpatient, PRN after discharge. ?  ?Tobacco abuse ?-Nicotine patch ?  ?Fall:  ?Seems to be a mechanical fall.  Patient refused CT scanning of head and neck.  No focal neuro deficit ?--PT rec home with HH ?  ?Acute renal failure superimposed on stage 3a chronic kidney disease ([place]):  ?Creatinine 1.1 on 03/23/2021.  Her creatinine is 1.41, BUN 12.  Cr improved with IVF hydration, was 0.97 prior to discharge. ?-home losartan held during hospitalization and resumed after discharge. ? ?HTN (hypertension):  ?Blood pressure  intermittently soft during hospitalization. ?--cont home metop ?--home losartan held but resumed after discharge. ?  ?HLD (hyperlipidemia) ?-Lipitor ?  ?Depression ?-Amitriptyline ?  ?GERD (gastroesophageal reflux disease) ?-Protonix ?  ? ?Discharge Diagnoses:  ?Principal Problem: ?  COPD exacerbation ([place]) ?Active Problems: ?  Fall ?  HTN (hypertension) ?  HLD (hyperlipidemia) ?  Depression ?  GERD (gastroesophageal reflux disease) ?  Tobacco abuse ?  Acute renal failure superimposed on stage 3a chronic kidney disease ([place]) ?  COPD with acute exacerbation ([place]) ? ? ?30 Day Unplanned Readmission Risk Score   ? ?Flowsheet Row ED to Hosp-Admission (Current) from 04/28/2021 in [place]  ?30 Day Unplanned Readmission Risk Score (%) 11.57 Filed at 04/30/2021 0801  ? ?  ? ? This score is the patient's risk of an unplanned readmission within 30 days of being discharged (0 -100%). The score is based on dignosis, age, lab data, medications, orders, and past utilization.   ?Low:  0-14.9   Medium: 15-21.9   High: 22-29.9   Extreme: 30 and above ? ?  ? ?  ? ? ?Discharge Instructions: ? ?Allergies as of 04/30/2021   ? ?   Reactions  ? Codeine Nausea And Vomiting  ? Darvon [propoxyphene] Nausea And Vomiting  ? Gabapentin Other (See Comments)  ? Hallucination  ? Lisinopril   ? Wellbutrin [bupropion] Other (See Comments)  ? "Makes me mean"  ? Sulfa Antibiotics Rash  ? Tetracyclines & Related Rash  ? Trazodone And Nefazodone Rash  ? ?  ? ?  ?  Medication List  ?  ? ?TAKE these medications   ? ?acetaminophen 500 MG tablet ?Commonly known as: TYLENOL ?Take 500-1,000 mg by mouth every 6 (six) hours as needed for mild pain or moderate pain. ?  ?albuterol 108 (90 Base) MCG/ACT inhaler ?Commonly known as: VENTOLIN HFA ?Inhale into the lungs every 6 (six) hours as needed for wheezing or shortness of breath. ?  ?amitriptyline 75 MG tablet ?Commonly known as: ELAVIL ?Take 75 mg by mouth at bedtime. ?   ?atorvastatin 40 MG tablet ?Commonly known as: LIPITOR ?Take 40 mg by mouth daily. ?  ?azithromycin 250 MG tablet ?Commonly known as: ZITHROMAX ?Take 1 tablet (250 mg total) by mouth daily for 2 days. ?Start taking on: May 01, 2021 ?What changed:  ?how much to take ?how to take this ?when to take this ?additional instructions ?  ?dextromethorphan-guaiFENesin 30-600 MG 12hr tablet ?Commonly known as: [place] DM ?Take 1 tablet by mouth 2 (two) times daily as needed for up to 10 days for cough. ?  ?ipratropium-albuterol 0.5-2.5 (3) MG/3ML Soln ?Commonly known as: DUONEB ?Take 3 mLs by nebulization every 6 (six) hours as needed. ?  ?losartan 50 MG tablet ?Commonly known as: COZAAR ?Take 50 mg by mouth daily. ?  ?methocarbamol 500 MG tablet ?Commonly known as: ROBAXIN ?Take 500 mg by mouth 2 (two) times daily as needed for muscle pain. ?  ?metoprolol tartrate 50 MG tablet ?Commonly known as: LOPRESSOR ?Take 50 mg by mouth 2 (two) times daily. ?  ?nicotine 21 mg/24hr patch ?Commonly known as: NICODERM CQ - dosed in mg/24 hours ?Place 1 patch (21 mg total) onto the skin daily. ?Start taking on: May 01, 2021 ?  ?omeprazole 40 MG capsule ?Commonly known as: PRILOSEC ?Take 40 mg by mouth 2 (two) times daily. ?  ?predniSONE 20 MG tablet ?Commonly known as: DELTASONE ?Take 2 tablets (40 mg total) by mouth daily with breakfast for 4 days. ?Start taking on: May 01, 2021 ?  ? ?  ? ? ? Follow-up Information   ? ? [facility] Follow up.   ?Why: they will call you within 48 hours of discharge to schedule your initial visit.  Call the [facility] with questions at [phone] ? ?  ?  ? ? [place]. Go on 05/07/2021.   ?Why: Hospital Discharge Followup. ? ?Appointment: Thursday on 05/07/2021 at 9:00am with Primary Doctor ?Contact information: ?[place] [address] ?[phone] ? ? ?  ?  ? ?  ?  ? ?  ? ? ?Allergies  ?Allergen Reactions  ? Codeine Nausea And Vomiting  ? Darvon  [Propoxyphene] Nausea And Vomiting  ? Gabapentin Other (See Comments)  ?  Hallucination ?  ? Lisinopril   ? Wellbutrin [Bupropion] Other (See Comments)  ?  "Makes me mean"  ? Sulfa Antibiotics Rash  ? Tetracyclines & Related Rash  ? Trazodone And Nefazodone Rash  ? ? ? ?The results of significant diagnostics from this hospitalization (including imaging, microbiology, ancillary and laboratory) are listed below for reference.  ? ?Consultations: ? ? ?Procedures/Studies: ?DG Chest Port 1 View ? ?Result Date: 04/28/2021 ?CLINICAL DATA:  66 year old female with shortness of breath, cough, low-grade fever. Smoker. Test for COVID-19 is pending. EXAM: PORTABLE CHEST 1 VIEW COMPARISON:  Chest radiographs 10/17/2018 and earlier. FINDINGS: Portable AP upright view at 0406 hours. Stable lung volumes and mediastinal contours, within normal limits. Visualized tracheal air column is within normal limits. Allowing for portable technique the  lungs are clear. No pneumothorax or pleural effusion. No acute osseous abnormality identified. Paucity of bowel gas in the upper abdomen. Stable cholecystectomy clips. IMPRESSION: No acute cardiopulmonary abnormality. Electronically Signed   By: Genevie Ann M.D.   On: 04/28/2021 04:35   ? ? ? ?Labs: ?BNP (last 3 results) ?Recent Labs  ?  04/28/21 ?0352  ?BNP 33.4  ? ?Basic Metabolic Panel: ?Recent Labs  ?Lab 04/28/21 ?0352 04/28/21 ?0403 04/29/21 ?0503 04/30/21 ?0443  ?NA 131*  --  139 140  ?K 4.0  --  3.9 4.8  ?CL 99  --  110 108  ?CO2 24  --  22 24  ?GLUCOSE 140*  --  165* 131*  ?BUN 12  --  12 14  ?CREATININE 1.41*  --  0.95 0.97  ?CALCIUM 8.8*  --  8.5* 9.1  ?MG  --  2.1  --  2.3  ? ?Liver Function Tests: ?No results for input(s): AST, ALT, ALKPHOS, BILITOT, PROT, ALBUMIN in the last 168 hours. ?No results for input(s): LIPASE, AMYLASE in the last 168 hours. ?No results for input(s): AMMONIA in the last 168 hours. ?CBC: ?Recent Labs  ?Lab 04/28/21 ?0352 04/29/21 ?0503 04/30/21 ?0443  ?WBC 12.0*  9.6 13.7*  ?NEUTROABS 8.7*  --   --   ?HGB 10.2* 8.7* 9.4*  ?HCT 32.6* 27.9* 30.8*  ?MCV 85.1 84.8 86.8  ?PLT 315 288 358  ? ?Cardiac Enzymes: ?No results for input(s): CKTOTAL, CKMB, CKMBINDEX, TROPONINI in the last

## 2021-05-01 LAB — CULTURE, RESPIRATORY W GRAM STAIN
Culture: NORMAL
Gram Stain: NONE SEEN

## 2021-05-01 NOTE — Progress Notes (Signed)
?   04/29/21 0048  ?Assess: MEWS Score  ?Temp 97.6 ?F (36.4 ?C)  ?BP (!) 149/68  ?Pulse Rate (!) 114 ?(rechecked 92)  ?Resp 18  ?SpO2 96 %  ?O2 Device Room Air  ?Patient Activity (if Appropriate) In bed  ?Assess: MEWS Score  ?MEWS Temp 0  ?MEWS Systolic 0  ?MEWS Pulse 2  ?MEWS RR 0  ?MEWS LOC 0  ?MEWS Score 2  ?MEWS Score Color Yellow  ?Assess: if the MEWS score is Yellow or Red  ?Were vital signs taken at a resting state? No  ?Focused Assessment No change from prior assessment  ?Does the patient meet 2 or more of the SIRS criteria? No  ?MEWS guidelines implemented *See Row Information* No, vital signs rechecked  ?Treat  ?MEWS Interventions Other (Comment)  ?Pain Scale 0-10  ?Pain Score 0  ?Document  ?Patient Outcome Other (Comment)  ?Progress note created (see row info) Yes ?(pt just walked to the bathroom prior to vitals. Pt is here for COPD exacerbation and get SOB when moving.)  ?Assess: SIRS CRITERIA  ?SIRS Temperature  0  ?SIRS Pulse 1  ?SIRS Respirations  0  ?SIRS WBC 0  ?SIRS Score Sum  1  ? ? ?

## 2021-05-03 LAB — CULTURE, BLOOD (ROUTINE X 2)
Culture: NO GROWTH
Culture: NO GROWTH
Special Requests: ADEQUATE
Special Requests: ADEQUATE

## 2023-11-25 ENCOUNTER — Ambulatory Visit
Admission: RE | Admit: 2023-11-25 | Discharge: 2023-11-25 | Disposition: A | Discharge status: Home / Self Care | Source: Ambulatory Visit | Attending: Family Medicine | Admitting: Family Medicine

## 2023-11-25 ENCOUNTER — Other Ambulatory Visit: Payer: Self-pay | Admitting: Family Medicine

## 2023-11-25 ENCOUNTER — Ambulatory Visit
Admission: RE | Admit: 2023-11-25 | Discharge: 2023-11-25 | Disposition: A | Discharge status: Home / Self Care | Attending: Family Medicine | Admitting: Family Medicine

## 2023-11-25 DIAGNOSIS — G8929 Other chronic pain: Secondary | ICD-10-CM

## 2023-11-28 ENCOUNTER — Other Ambulatory Visit: Payer: Self-pay | Admitting: Family Medicine

## 2023-11-28 DIAGNOSIS — R7303 Prediabetes: Secondary | ICD-10-CM

## 2023-11-28 DIAGNOSIS — I6523 Occlusion and stenosis of bilateral carotid arteries: Secondary | ICD-10-CM

## 2023-12-01 ENCOUNTER — Ambulatory Visit
Admission: RE | Admit: 2023-12-01 | Discharge: 2023-12-01 | Disposition: A | Discharge status: Home / Self Care | Source: Ambulatory Visit | Attending: Family Medicine | Admitting: Family Medicine

## 2023-12-01 DIAGNOSIS — R7303 Prediabetes: Secondary | ICD-10-CM | POA: Diagnosis present

## 2023-12-01 DIAGNOSIS — I6523 Occlusion and stenosis of bilateral carotid arteries: Secondary | ICD-10-CM | POA: Insufficient documentation
